# Patient Record
Sex: Female | Born: 1940
Health system: Southern US, Community
[De-identification: ages and names within clinical notes are randomized; demographics above are authoritative.]

## PROBLEM LIST (undated history)

## (undated) DIAGNOSIS — N3020 Other chronic cystitis without hematuria: Secondary | ICD-10-CM

## (undated) DIAGNOSIS — I10 Essential (primary) hypertension: Secondary | ICD-10-CM

## (undated) DIAGNOSIS — Z86010 Personal history of colonic polyps: Secondary | ICD-10-CM

## (undated) DIAGNOSIS — J45909 Unspecified asthma, uncomplicated: Secondary | ICD-10-CM

## (undated) DIAGNOSIS — E785 Hyperlipidemia, unspecified: Secondary | ICD-10-CM

## (undated) HISTORY — PX: WISDOM TOOTH EXTRACTION: SHX21

## (undated) HISTORY — PX: BLADDER SURGERY: SHX569

## (undated) HISTORY — DX: Other chronic cystitis without hematuria: N30.20

## (undated) HISTORY — DX: Personal history of colonic polyps: Z86.010

## (undated) HISTORY — DX: Essential (primary) hypertension: I10

## (undated) HISTORY — PX: MYOMECTOMY: SHX85

## (undated) HISTORY — DX: Hyperlipidemia, unspecified: E78.5

## (undated) HISTORY — PX: COLONOSCOPY: SHX174

## (undated) HISTORY — DX: Unspecified asthma, uncomplicated: J45.909

---

## 2011-03-30 ENCOUNTER — Other Ambulatory Visit (HOSPITAL_COMMUNITY)
Admission: RE | Admit: 2011-03-30 | Discharge: 2011-03-30 | Disposition: A | Discharge status: Home / Self Care | Payer: No Typology Code available for payment source | Source: Ambulatory Visit | Attending: Internal Medicine | Admitting: Internal Medicine

## 2011-03-30 DIAGNOSIS — Z01419 Encounter for gynecological examination (general) (routine) without abnormal findings: Secondary | ICD-10-CM | POA: Insufficient documentation

## 2011-04-01 ENCOUNTER — Other Ambulatory Visit: Payer: Self-pay | Admitting: Internal Medicine

## 2011-04-01 DIAGNOSIS — D259 Leiomyoma of uterus, unspecified: Secondary | ICD-10-CM

## 2011-04-03 ENCOUNTER — Other Ambulatory Visit: Payer: Self-pay

## 2015-03-08 DIAGNOSIS — E559 Vitamin D deficiency, unspecified: Secondary | ICD-10-CM | POA: Diagnosis not present

## 2015-05-22 DIAGNOSIS — Z803 Family history of malignant neoplasm of breast: Secondary | ICD-10-CM | POA: Diagnosis not present

## 2015-05-22 DIAGNOSIS — Z1231 Encounter for screening mammogram for malignant neoplasm of breast: Secondary | ICD-10-CM | POA: Diagnosis not present

## 2015-07-22 DIAGNOSIS — R300 Dysuria: Secondary | ICD-10-CM | POA: Diagnosis not present

## 2015-07-22 DIAGNOSIS — N390 Urinary tract infection, site not specified: Secondary | ICD-10-CM | POA: Diagnosis not present

## 2015-09-09 DIAGNOSIS — R3129 Other microscopic hematuria: Secondary | ICD-10-CM | POA: Diagnosis not present

## 2015-09-09 DIAGNOSIS — Z0000 Encounter for general adult medical examination without abnormal findings: Secondary | ICD-10-CM | POA: Diagnosis not present

## 2015-12-16 DIAGNOSIS — E7800 Pure hypercholesterolemia, unspecified: Secondary | ICD-10-CM | POA: Diagnosis not present

## 2015-12-16 DIAGNOSIS — E559 Vitamin D deficiency, unspecified: Secondary | ICD-10-CM | POA: Diagnosis not present

## 2015-12-16 DIAGNOSIS — I10 Essential (primary) hypertension: Secondary | ICD-10-CM | POA: Diagnosis not present

## 2015-12-19 DIAGNOSIS — I10 Essential (primary) hypertension: Secondary | ICD-10-CM | POA: Diagnosis not present

## 2015-12-19 DIAGNOSIS — Z23 Encounter for immunization: Secondary | ICD-10-CM | POA: Diagnosis not present

## 2015-12-19 DIAGNOSIS — Z0000 Encounter for general adult medical examination without abnormal findings: Secondary | ICD-10-CM | POA: Diagnosis not present

## 2015-12-19 DIAGNOSIS — E559 Vitamin D deficiency, unspecified: Secondary | ICD-10-CM | POA: Diagnosis not present

## 2015-12-20 ENCOUNTER — Encounter: Payer: Self-pay | Admitting: Internal Medicine

## 2016-02-05 ENCOUNTER — Encounter (INDEPENDENT_AMBULATORY_CARE_PROVIDER_SITE_OTHER): Payer: Self-pay

## 2016-02-05 ENCOUNTER — Ambulatory Visit (AMBULATORY_SURGERY_CENTER): Payer: Self-pay | Admitting: *Deleted

## 2016-02-05 ENCOUNTER — Encounter: Payer: Self-pay | Admitting: Internal Medicine

## 2016-02-05 VITALS — Ht 59.0 in | Wt 202.6 lb

## 2016-02-05 DIAGNOSIS — Z1211 Encounter for screening for malignant neoplasm of colon: Secondary | ICD-10-CM

## 2016-02-05 NOTE — Progress Notes (Signed)
No allergies to eggs or soy. No problems with anesthesia.  Pt given Emmi instructions for colonoscopy  No oxygen use  No diet drug use  Pt says she does not know who did her colonoscopy in 2005.  She is going to check with her primary care MD in [place] and call back with info.  Release of information form signed

## 2016-02-19 ENCOUNTER — Encounter: Payer: Self-pay | Admitting: Internal Medicine

## 2016-02-19 ENCOUNTER — Ambulatory Visit (AMBULATORY_SURGERY_CENTER): Payer: Medicare Other | Admitting: Internal Medicine

## 2016-02-19 VITALS — BP 106/66 | HR 59 | Temp 96.0°F | Resp 12 | Ht 59.0 in | Wt 202.0 lb

## 2016-02-19 DIAGNOSIS — D120 Benign neoplasm of cecum: Secondary | ICD-10-CM | POA: Diagnosis not present

## 2016-02-19 DIAGNOSIS — D123 Benign neoplasm of transverse colon: Secondary | ICD-10-CM

## 2016-02-19 DIAGNOSIS — Z1212 Encounter for screening for malignant neoplasm of rectum: Secondary | ICD-10-CM

## 2016-02-19 DIAGNOSIS — Z1211 Encounter for screening for malignant neoplasm of colon: Secondary | ICD-10-CM | POA: Diagnosis present

## 2016-02-19 MED ORDER — SODIUM CHLORIDE 0.9 % IV SOLN: 500.0000 mL | INTRAVENOUS | Status: DC

## 2016-02-19 NOTE — Op Note (Addendum)
[facility] Patient Name: Diana Mcknight Procedure Date: 02/19/2016 10:17 AM MRN: [id number] Endoscopist: Gatha Mayer , MD Age: 75 Referring MD:  Date of Birth: 03-Dec-1940 Gender: Female Account #: [id number] Procedure:                Colonoscopy Indications:              Screening for colorectal malignant neoplasm, Last                            colonoscopy: 2005 Medicines:                Propofol per Anesthesia, Monitored Anesthesia Care Procedure:                Pre-Anesthesia Assessment:                           - Prior to the procedure, a History and Physical                            was performed, and patient medications and                            allergies were reviewed. The patient's tolerance of                            previous anesthesia was also reviewed. The risks                            and benefits of the procedure and the sedation                            options and risks were discussed with the patient.                            All questions were answered, and informed consent                            was obtained. Prior Anticoagulants: The patient has                            taken no previous anticoagulant or antiplatelet                            agents. ASA Grade Assessment: II - A patient with                            mild systemic disease. After reviewing the risks                            and benefits, the patient was deemed in                            satisfactory condition to undergo the procedure.  After obtaining informed consent, the colonoscope                            was passed under direct vision. Throughout the                            procedure, the patient's blood pressure, pulse, and                            oxygen saturations were monitored continuously. The                            Model CF-HQ190L [id number]) scope was introduced                            through the  anus and advanced to the the cecum,                            identified by appendiceal orifice and ileocecal                            valve. The colonoscopy was performed without                            difficulty. The patient tolerated the procedure                            well. The quality of the bowel preparation was                            excellent. The bowel preparation used was Miralax.                            The ileocecal valve, appendiceal orifice, and                            rectum were photographed. Scope In: 10:28:25 AM Scope Out: 10:43:27 AM Scope Withdrawal Time: 0 hours 12 minutes 32 seconds  Total Procedure Duration: 0 hours 15 minutes 2 seconds  Findings:                 The perianal and digital rectal examinations were                            normal.                           Three pedunculated polyps were found in the                            transverse colon, proximal transverse colon and                            cecum. The polyps were 8 to 12 mm in size. These  polyps were removed with a hot snare. Resection and                            retrieval were complete. Verification of patient                            identification for the specimen was done. Estimated                            blood loss: none.                           Diverticula were found in the sigmoid colon.                           Diverticula were found in the ascending colon.                           Internal hemorrhoids were found during retroflexion.                           The exam was otherwise without abnormality on                            direct and retroflexion views. Complications:            No immediate complications. Estimated Blood Loss:     Estimated blood loss: none. Impression:               - Three 8 to 12 mm polyps in the transverse colon,                            in the proximal transverse colon and in the cecum,                             removed with a hot snare. Resected and retrieved.                           - Severe diverticulosis in the sigmoid colon.                           - Moderate diverticulosis in the ascending colon.                           - Internal hemorrhoids.                           - The examination was otherwise normal on direct                            and retroflexion views. Recommendation:           - Patient has a contact number available for                            emergencies. The signs  and symptoms of potential                            delayed complications were discussed with the                            patient. Return to normal activities tomorrow.                            Written discharge instructions were provided to the                            patient.                           - Resume previous diet.                           - Continue present medications.                           - No aspirin, ibuprofen, naproxen, or other                            non-steroidal anti-inflammatory drugs for 2 weeks                            after polyp removal.                           - Repeat colonoscopy is recommended. The                            colonoscopy date will be determined after pathology                            results from today's exam become available for                            review.                           -                           SHE IS 74 SO THIS COULD BE LAST ROUTINE COLONOSCOPY                            - THAT IS HER PREFERENCE Gatha Mayer, MD 02/19/2016 10:55:07 AM This report has been signed electronically.

## 2016-02-19 NOTE — Progress Notes (Signed)
Report to PACU, RN, vss, BBS= Clear.  

## 2016-02-19 NOTE — Patient Instructions (Addendum)
I found and removed 3 polyps today - all look benign.  I will let you know pathology results and when to have another routine colonoscopy by mail.  You also have a condition called diverticulosis - common and not usually a problem. Please read the handout provided.   I appreciate the opportunity to care for you. Gatha Mayer, MD, FACG  YOU HAD AN ENDOSCOPIC PROCEDURE TODAY AT [facility]:   Refer to the procedure report that was given to you for any specific questions about what was found during the examination.  If the procedure report does not answer your questions, please call your gastroenterologist to clarify.  If you requested that your care partner not be given the details of your procedure findings, then the procedure report has been included in a sealed envelope for you to review at your convenience later.  YOU SHOULD EXPECT: Some feelings of bloating in the abdomen. Passage of more gas than usual.  Walking can help get rid of the air that was put into your GI tract during the procedure and reduce the bloating. If you had a lower endoscopy (such as a colonoscopy or flexible sigmoidoscopy) you may notice spotting of blood in your stool or on the toilet paper. If you underwent a bowel prep for your procedure, you may not have a normal bowel movement for a few days.  Please Note:  You might notice some irritation and congestion in your nose or some drainage.  This is from the oxygen used during your procedure.  There is no need for concern and it should clear up in a day or so.  SYMPTOMS TO REPORT IMMEDIATELY:   Following lower endoscopy (colonoscopy or flexible sigmoidoscopy):  Excessive amounts of blood in the stool  Significant tenderness or worsening of abdominal pains  Swelling of the abdomen that is new, acute  Fever of 100F or higher   Following upper endoscopy (EGD)  Vomiting of blood or coffee ground material  New chest pain or pain under the  shoulder blades  Painful or persistently difficult swallowing  New shortness of breath  Fever of 100F or higher  Black, tarry-looking stools  For urgent or emergent issues, a gastroenterologist can be reached at any hour by calling [phone].   DIET:  We do recommend a small meal at first, but then you may proceed to your regular diet.  Drink plenty of fluids but you should avoid alcoholic beverages for 24 hours.  ACTIVITY:  You should plan to take it easy for the rest of today and you should NOT DRIVE or use heavy machinery until tomorrow (because of the sedation medicines used during the test).    FOLLOW UP: Our staff will call the number listed on your records the next business day following your procedure to check on you and address any questions or concerns that you may have regarding the information given to you following your procedure. If we do not reach you, we will leave a message.  However, if you are feeling well and you are not experiencing any problems, there is no need to return our call.  We will assume that you have returned to your regular daily activities without incident.  If any biopsies were taken you will be contacted by phone or by letter within the next 1-3 weeks.  Please call us at [phone] if you have not heard about the biopsies in 3 weeks.    SIGNATURES/CONFIDENTIALITY: You and/or your  care partner have signed paperwork which will be entered into your electronic medical record.  These signatures attest to the fact that that the information above on your After Visit Summary has been reviewed and is understood.  Full responsibility of the confidentiality of this discharge information lies with you and/or your care-partner.  Polyp, diverticulosis, and hemorrhoid information given.

## 2016-02-19 NOTE — Progress Notes (Signed)
Called to room to assist during endoscopic procedure.  Patient ID and intended procedure confirmed with present staff. Received instructions for my participation in the procedure from the performing physician.  

## 2016-02-20 ENCOUNTER — Telehealth: Payer: Self-pay

## 2016-02-20 NOTE — Telephone Encounter (Signed)
  Follow up Call-  Call back number 02/19/2016  Post procedure Call Back phone  # [phone] hm  Permission to leave phone message Yes  Some recent data might be hidden     Patient questions:  Do you have a fever, pain , or abdominal swelling? No. Pain Score  0 *  Have you tolerated food without any problems? Yes.    Have you been able to return to your normal activities? Yes.    Do you have any questions about your discharge instructions: Diet   No. Medications  No. Follow up visit  No.  Do you have questions or concerns about your Care? No.  Actions: * If pain score is 4 or above: No action needed, pain <4.

## 2016-02-24 ENCOUNTER — Encounter: Payer: Self-pay | Admitting: Internal Medicine

## 2016-02-24 DIAGNOSIS — Z860101 Personal history of adenomatous and serrated colon polyps: Secondary | ICD-10-CM

## 2016-02-24 DIAGNOSIS — Z86010 Personal history of colonic polyps: Secondary | ICD-10-CM

## 2016-02-24 HISTORY — DX: Personal history of colonic polyps: Z86.010

## 2016-02-24 HISTORY — DX: Personal history of adenomatous and serrated colon polyps: Z86.0101

## 2016-02-24 NOTE — Progress Notes (Signed)
Three 8-12 mm adenomas No recall - patient preference

## 2016-03-09 ENCOUNTER — Other Ambulatory Visit (HOSPITAL_COMMUNITY): Payer: Self-pay | Admitting: Internal Medicine

## 2016-03-09 DIAGNOSIS — R600 Localized edema: Secondary | ICD-10-CM | POA: Diagnosis not present

## 2016-03-09 DIAGNOSIS — M7989 Other specified soft tissue disorders: Secondary | ICD-10-CM

## 2016-03-09 DIAGNOSIS — M79605 Pain in left leg: Secondary | ICD-10-CM | POA: Diagnosis not present

## 2016-03-10 ENCOUNTER — Ambulatory Visit (HOSPITAL_COMMUNITY)
Admission: RE | Admit: 2016-03-10 | Discharge: 2016-03-10 | Disposition: A | Discharge status: Home / Self Care | Payer: Medicare Other | Source: Ambulatory Visit | Attending: Vascular Surgery | Admitting: Vascular Surgery

## 2016-03-10 DIAGNOSIS — M7989 Other specified soft tissue disorders: Secondary | ICD-10-CM

## 2016-03-10 DIAGNOSIS — R600 Localized edema: Secondary | ICD-10-CM | POA: Insufficient documentation

## 2016-03-12 DIAGNOSIS — R3121 Asymptomatic microscopic hematuria: Secondary | ICD-10-CM | POA: Diagnosis not present

## 2016-05-22 DIAGNOSIS — Z803 Family history of malignant neoplasm of breast: Secondary | ICD-10-CM | POA: Diagnosis not present

## 2016-05-22 DIAGNOSIS — Z1231 Encounter for screening mammogram for malignant neoplasm of breast: Secondary | ICD-10-CM | POA: Diagnosis not present

## 2016-06-22 DIAGNOSIS — I10 Essential (primary) hypertension: Secondary | ICD-10-CM | POA: Diagnosis not present

## 2016-09-08 DIAGNOSIS — R3121 Asymptomatic microscopic hematuria: Secondary | ICD-10-CM | POA: Diagnosis not present

## 2016-12-15 DIAGNOSIS — E7800 Pure hypercholesterolemia, unspecified: Secondary | ICD-10-CM | POA: Diagnosis not present

## 2016-12-15 DIAGNOSIS — E559 Vitamin D deficiency, unspecified: Secondary | ICD-10-CM | POA: Diagnosis not present

## 2016-12-15 DIAGNOSIS — Z0000 Encounter for general adult medical examination without abnormal findings: Secondary | ICD-10-CM | POA: Diagnosis not present

## 2016-12-22 DIAGNOSIS — Z0000 Encounter for general adult medical examination without abnormal findings: Secondary | ICD-10-CM | POA: Diagnosis not present

## 2016-12-22 DIAGNOSIS — R319 Hematuria, unspecified: Secondary | ICD-10-CM | POA: Diagnosis not present

## 2016-12-22 DIAGNOSIS — Z1212 Encounter for screening for malignant neoplasm of rectum: Secondary | ICD-10-CM | POA: Diagnosis not present

## 2016-12-22 DIAGNOSIS — I10 Essential (primary) hypertension: Secondary | ICD-10-CM | POA: Diagnosis not present

## 2017-01-07 DIAGNOSIS — I10 Essential (primary) hypertension: Secondary | ICD-10-CM | POA: Diagnosis not present

## 2017-01-07 DIAGNOSIS — E782 Mixed hyperlipidemia: Secondary | ICD-10-CM | POA: Diagnosis not present

## 2017-02-22 DIAGNOSIS — Z23 Encounter for immunization: Secondary | ICD-10-CM | POA: Diagnosis not present

## 2017-02-22 DIAGNOSIS — M859 Disorder of bone density and structure, unspecified: Secondary | ICD-10-CM | POA: Diagnosis not present

## 2017-03-01 DIAGNOSIS — H2513 Age-related nuclear cataract, bilateral: Secondary | ICD-10-CM | POA: Diagnosis not present

## 2017-05-14 DIAGNOSIS — Z1231 Encounter for screening mammogram for malignant neoplasm of breast: Secondary | ICD-10-CM | POA: Diagnosis not present

## 2017-08-31 DIAGNOSIS — E7800 Pure hypercholesterolemia, unspecified: Secondary | ICD-10-CM | POA: Diagnosis not present

## 2017-08-31 DIAGNOSIS — I10 Essential (primary) hypertension: Secondary | ICD-10-CM | POA: Diagnosis not present

## 2017-08-31 DIAGNOSIS — J309 Allergic rhinitis, unspecified: Secondary | ICD-10-CM | POA: Diagnosis not present

## 2017-09-09 DIAGNOSIS — R3121 Asymptomatic microscopic hematuria: Secondary | ICD-10-CM | POA: Diagnosis not present

## 2017-12-21 DIAGNOSIS — E559 Vitamin D deficiency, unspecified: Secondary | ICD-10-CM | POA: Diagnosis not present

## 2017-12-21 DIAGNOSIS — E7800 Pure hypercholesterolemia, unspecified: Secondary | ICD-10-CM | POA: Diagnosis not present

## 2017-12-21 DIAGNOSIS — Z0001 Encounter for general adult medical examination with abnormal findings: Secondary | ICD-10-CM | POA: Diagnosis not present

## 2017-12-21 DIAGNOSIS — I10 Essential (primary) hypertension: Secondary | ICD-10-CM | POA: Diagnosis not present

## 2017-12-28 DIAGNOSIS — I10 Essential (primary) hypertension: Secondary | ICD-10-CM | POA: Diagnosis not present

## 2017-12-28 DIAGNOSIS — Z0000 Encounter for general adult medical examination without abnormal findings: Secondary | ICD-10-CM | POA: Diagnosis not present

## 2017-12-28 DIAGNOSIS — R319 Hematuria, unspecified: Secondary | ICD-10-CM | POA: Diagnosis not present

## 2017-12-28 DIAGNOSIS — D72819 Decreased white blood cell count, unspecified: Secondary | ICD-10-CM | POA: Diagnosis not present

## 2017-12-28 DIAGNOSIS — E559 Vitamin D deficiency, unspecified: Secondary | ICD-10-CM | POA: Diagnosis not present

## 2017-12-28 DIAGNOSIS — Z1212 Encounter for screening for malignant neoplasm of rectum: Secondary | ICD-10-CM | POA: Diagnosis not present

## 2017-12-28 DIAGNOSIS — Z23 Encounter for immunization: Secondary | ICD-10-CM | POA: Diagnosis not present

## 2018-03-02 DIAGNOSIS — H2513 Age-related nuclear cataract, bilateral: Secondary | ICD-10-CM | POA: Diagnosis not present

## 2018-05-23 DIAGNOSIS — Z1231 Encounter for screening mammogram for malignant neoplasm of breast: Secondary | ICD-10-CM | POA: Diagnosis not present

## 2018-05-23 DIAGNOSIS — Z803 Family history of malignant neoplasm of breast: Secondary | ICD-10-CM | POA: Diagnosis not present

## 2018-08-31 DIAGNOSIS — I10 Essential (primary) hypertension: Secondary | ICD-10-CM | POA: Diagnosis not present

## 2018-08-31 DIAGNOSIS — M79604 Pain in right leg: Secondary | ICD-10-CM | POA: Diagnosis not present

## 2018-09-05 DIAGNOSIS — M79605 Pain in left leg: Secondary | ICD-10-CM | POA: Insufficient documentation

## 2018-09-05 DIAGNOSIS — M79604 Pain in right leg: Secondary | ICD-10-CM | POA: Diagnosis not present

## 2018-09-12 DIAGNOSIS — M79605 Pain in left leg: Secondary | ICD-10-CM | POA: Diagnosis not present

## 2018-09-21 DIAGNOSIS — M79605 Pain in left leg: Secondary | ICD-10-CM | POA: Diagnosis not present

## 2018-09-21 DIAGNOSIS — M79604 Pain in right leg: Secondary | ICD-10-CM | POA: Diagnosis not present

## 2018-11-04 ENCOUNTER — Other Ambulatory Visit: Payer: Self-pay

## 2018-11-04 DIAGNOSIS — L89899 Pressure ulcer of other site, unspecified stage: Secondary | ICD-10-CM

## 2018-11-08 ENCOUNTER — Encounter: Payer: Self-pay | Admitting: Vascular Surgery

## 2018-11-08 ENCOUNTER — Ambulatory Visit (HOSPITAL_COMMUNITY)
Admission: RE | Admit: 2018-11-08 | Discharge: 2018-11-08 | Disposition: A | Discharge status: Home / Self Care | Payer: Medicare Other | Source: Ambulatory Visit | Attending: Vascular Surgery | Admitting: Vascular Surgery

## 2018-11-08 ENCOUNTER — Ambulatory Visit (INDEPENDENT_AMBULATORY_CARE_PROVIDER_SITE_OTHER): Payer: Medicare Other | Admitting: Vascular Surgery

## 2018-11-08 ENCOUNTER — Other Ambulatory Visit: Payer: Self-pay

## 2018-11-08 ENCOUNTER — Encounter: Payer: Self-pay | Admitting: *Deleted

## 2018-11-08 VITALS — BP 113/75 | HR 69 | Temp 97.4°F | Resp 20 | Ht 59.0 in | Wt 199.3 lb

## 2018-11-08 DIAGNOSIS — M79604 Pain in right leg: Secondary | ICD-10-CM | POA: Diagnosis not present

## 2018-11-08 DIAGNOSIS — I10 Essential (primary) hypertension: Secondary | ICD-10-CM | POA: Insufficient documentation

## 2018-11-08 DIAGNOSIS — E785 Hyperlipidemia, unspecified: Secondary | ICD-10-CM | POA: Insufficient documentation

## 2018-11-08 DIAGNOSIS — L89899 Pressure ulcer of other site, unspecified stage: Secondary | ICD-10-CM | POA: Insufficient documentation

## 2018-11-08 DIAGNOSIS — J45909 Unspecified asthma, uncomplicated: Secondary | ICD-10-CM | POA: Insufficient documentation

## 2018-11-08 NOTE — Progress Notes (Signed)
Patient name: Diana Mcknight MRN: [id number] DOB: 07/20/1940 Sex: female  REASON FOR CONSULT: Evaluate for right leg arterial insufficiency, pain in right calf  HPI: [name] is a 78 y.o. female, with history of hypertension and hyperlipidemia that presents for evaluation of right calf pain and possible arterial insufficiency.  Patient reports that she is recovering from a right gastrocnemius strain back in April of this year.  She feels that this potentially aggravated an old injury from [place] that occurred about 20 years ago in the same calf.  States she was getting out of her computer chair after working in the garden when it happened.  She subsequently noticed that sometimes at night she gets pain in her right heel and some numbness and was worried that she potentially had vascular insufficiency.  This was also in the setting of some discoloration on the top of the right foot.  She denies any symptoms consistent with claudication.  She has no tissue loss or other nonhealing wounds.  She states the toes themselves do not hurt.  No previous revascularizations.  No previous blood clots.  She states she gardens and tries to be active and walks a fair amount.  Past Medical History:  Diagnosis Date  . Asthma    childhood asthma  . Chronic cystitis   . Hx of adenomatous colonic polyps 02/24/2016  . Hyperlipidemia   . Hypertension     Past Surgical History:  Procedure Laterality Date  . BLADDER SURGERY     fulguration  . COLONOSCOPY    . MYOMECTOMY    . WISDOM TOOTH EXTRACTION      Family History  Problem Relation Age of Onset  . Liver cancer Brother   . Colon cancer Neg Hx   . Esophageal cancer Neg Hx   . Pancreatic cancer Neg Hx   . Rectal cancer Neg Hx   . Stomach cancer Neg Hx     SOCIAL HISTORY: Social History   Socioeconomic History  . Marital status: Unknown    Spouse name: Not on file  . Number of children: Not on file  . Years of education: Not on file  .  Highest education level: Not on file  Occupational History  . Not on file  Social Needs  . Financial resource strain: Not on file  . Food insecurity    Worry: Not on file    Inability: Not on file  . Transportation needs    Medical: Not on file    Non-medical: Not on file  Tobacco Use  . Smoking status: Never Smoker  . Smokeless tobacco: Never Used  Substance and Sexual Activity  . Alcohol use: No  . Drug use: No  . Sexual activity: Not on file  Lifestyle  . Physical activity    Days per week: Not on file    Minutes per session: Not on file  . Stress: Not on file  Relationships  . Social Herbalist on phone: Not on file    Gets together: Not on file    Attends religious service: Not on file    Active member of club or organization: Not on file    Attends meetings of clubs or organizations: Not on file    Relationship status: Not on file  . Intimate partner violence    Fear of current or ex partner: Not on file    Emotionally abused: Not on file    Physically abused: Not on file  Forced sexual activity: Not on file  Other Topics Concern  . Not on file  Social History Narrative  . Not on file    Allergies  Allergen Reactions  . Erythromycin Diarrhea  . Penicillins Hives    Current Outpatient Medications  Medication Sig Dispense Refill  . Cholecalciferol (VITAMIN D) 2000 units CAPS Take by mouth daily.    . hydrochlorothiazide (HYDRODIURIL) 25 MG tablet Take 25 mg by mouth daily.    Marland Kitchen losartan (COZAAR) 50 MG tablet Take 50 mg by mouth daily.     Current Facility-Administered Medications  Medication Dose Route Frequency Provider Last Rate Last Dose  . 0.9 %  sodium chloride infusion  500 mL Intravenous Continuous Gatha Mayer, MD        REVIEW OF SYSTEMS:  [X]  denotes positive finding, [ ]  denotes negative finding Cardiac  Comments:  Chest pain or chest pressure:    Shortness of breath upon exertion:    Short of breath when lying flat:     Irregular heart rhythm:        Vascular    Pain in calf, thigh, or hip brought on by ambulation:    Pain in feet at night that wakes you up from your sleep:  x Right heel  Blood clot in your veins:    Leg swelling:         Pulmonary    Oxygen at home:    Productive cough:     Wheezing:         Neurologic    Sudden weakness in arms or legs:     Sudden numbness in arms or legs:     Sudden onset of difficulty speaking or slurred speech:    Temporary loss of vision in one eye:     Problems with dizziness:         Gastrointestinal    Blood in stool:     Vomited blood:         Genitourinary    Burning when urinating:     Blood in urine:        Psychiatric    Major depression:         Hematologic    Bleeding problems:    Problems with blood clotting too easily:        Skin    Rashes or ulcers:        Constitutional    Fever or chills:      PHYSICAL EXAM: Vitals:   11/08/18 1441  BP: 113/75  Pulse: 69  Resp: 20  Temp: (!) 97.4 F (36.3 C)  Weight: 199 lb 4.8 oz (90.4 kg)  Height: 4\' 11"  (1.499 m)    GENERAL: The patient is a well-nourished female, in no acute distress. The vital signs are documented above. CARDIAC: There is a regular rate and rhythm.  VASCULAR:  2+ radial pulse palpable bilaterally 2+ femoral pulse palpable bilaterally 2+ DP palpable bilaterally No tissue loss PULMONARY: There is good air exchange bilaterally without wheezing or rales. ABDOMEN: Soft and non-tender with normal pitched bowel sounds.  MUSCULOSKELETAL: There are no major deformities or cyanosis. NEUROLOGIC: No focal weakness or paresthesias are detected. SKIN: There are no ulcers or rashes noted. PSYCHIATRIC: The patient has a normal affect.  DATA:   I independently reviewed her noninvasive imaging and she has normal ABIs greater than 1.0 in bilateral lower extremities with triphasic waveforms and normal toe pressures.  Assessment/Plan:  78 year old female who presents  for evaluation  of possible arterial insufficiency in the right lower extremity after calf muscle strain with some numbness in her right heel.  I discussed with her in detail that I do not think she has any concern for underlying vascular disease.  She has normal ABIs with triphasic waveforms at both ankles.  In addition she has palpable dorsalis pedis pulses on exam.  Her feet are warm and appear very well perfused.  I think she can follow with up with [place] as needed.  I do not think she needs any vascular intervention at this time.    Marty Heck, MD [facility] Office: [phone] Pager: [phone]

## 2018-11-17 DIAGNOSIS — R3121 Asymptomatic microscopic hematuria: Secondary | ICD-10-CM | POA: Diagnosis not present

## 2018-12-28 DIAGNOSIS — E559 Vitamin D deficiency, unspecified: Secondary | ICD-10-CM | POA: Diagnosis not present

## 2018-12-28 DIAGNOSIS — E7800 Pure hypercholesterolemia, unspecified: Secondary | ICD-10-CM | POA: Diagnosis not present

## 2018-12-28 DIAGNOSIS — Z0000 Encounter for general adult medical examination without abnormal findings: Secondary | ICD-10-CM | POA: Diagnosis not present

## 2019-01-04 DIAGNOSIS — Z780 Asymptomatic menopausal state: Secondary | ICD-10-CM | POA: Diagnosis not present

## 2019-01-04 DIAGNOSIS — Z23 Encounter for immunization: Secondary | ICD-10-CM | POA: Diagnosis not present

## 2019-01-04 DIAGNOSIS — Z0000 Encounter for general adult medical examination without abnormal findings: Secondary | ICD-10-CM | POA: Diagnosis not present

## 2019-01-04 DIAGNOSIS — M8580 Other specified disorders of bone density and structure, unspecified site: Secondary | ICD-10-CM | POA: Diagnosis not present

## 2019-02-10 ENCOUNTER — Encounter: Payer: Self-pay | Admitting: Internal Medicine

## 2019-05-29 DIAGNOSIS — Z1231 Encounter for screening mammogram for malignant neoplasm of breast: Secondary | ICD-10-CM | POA: Diagnosis not present

## 2019-08-29 DIAGNOSIS — L03115 Cellulitis of right lower limb: Secondary | ICD-10-CM | POA: Diagnosis not present

## 2019-09-01 DIAGNOSIS — L03115 Cellulitis of right lower limb: Secondary | ICD-10-CM | POA: Diagnosis not present

## 2020-01-04 DIAGNOSIS — E7800 Pure hypercholesterolemia, unspecified: Secondary | ICD-10-CM | POA: Diagnosis not present

## 2020-01-10 DIAGNOSIS — Z0000 Encounter for general adult medical examination without abnormal findings: Secondary | ICD-10-CM | POA: Diagnosis not present

## 2020-01-10 DIAGNOSIS — I10 Essential (primary) hypertension: Secondary | ICD-10-CM | POA: Diagnosis not present

## 2020-01-10 DIAGNOSIS — E7800 Pure hypercholesterolemia, unspecified: Secondary | ICD-10-CM | POA: Diagnosis not present

## 2020-01-18 DIAGNOSIS — I10 Essential (primary) hypertension: Secondary | ICD-10-CM | POA: Diagnosis not present

## 2020-01-18 DIAGNOSIS — Z1212 Encounter for screening for malignant neoplasm of rectum: Secondary | ICD-10-CM | POA: Diagnosis not present

## 2020-01-31 DIAGNOSIS — H2513 Age-related nuclear cataract, bilateral: Secondary | ICD-10-CM | POA: Diagnosis not present

## 2020-06-03 DIAGNOSIS — Z1231 Encounter for screening mammogram for malignant neoplasm of breast: Secondary | ICD-10-CM | POA: Diagnosis not present

## 2020-06-17 DIAGNOSIS — R928 Other abnormal and inconclusive findings on diagnostic imaging of breast: Secondary | ICD-10-CM | POA: Diagnosis not present

## 2020-06-17 DIAGNOSIS — R922 Inconclusive mammogram: Secondary | ICD-10-CM | POA: Diagnosis not present

## 2020-07-10 ENCOUNTER — Other Ambulatory Visit: Payer: Self-pay | Admitting: Radiology

## 2020-07-10 DIAGNOSIS — D0511 Intraductal carcinoma in situ of right breast: Secondary | ICD-10-CM | POA: Diagnosis not present

## 2020-07-12 ENCOUNTER — Telehealth: Payer: Self-pay | Admitting: *Deleted

## 2020-07-12 ENCOUNTER — Telehealth: Payer: Self-pay | Admitting: Internal Medicine

## 2020-07-12 ENCOUNTER — Encounter: Payer: Self-pay | Admitting: *Deleted

## 2020-07-12 DIAGNOSIS — D0511 Intraductal carcinoma in situ of right breast: Secondary | ICD-10-CM | POA: Insufficient documentation

## 2020-07-12 NOTE — Telephone Encounter (Signed)
I called and spoke with patient to let her know that her transportation is all set up for 3/23. They will pick her up between 7:30 and 7:45. Transportation will call her today as well to confirm. Patient verbalized understanding.

## 2020-07-12 NOTE — Telephone Encounter (Signed)
   Diana Mcknight DOB: 1941/01/31 MRN: [id number]   RIDER WAIVER AND RELEASE OF LIABILITY  For purposes of improving physical access to our facilities, [facility] is pleased to partner with third parties to provide [facility] patients or other authorized individuals the option of convenient, on-demand ground transportation services (the [place]") through use of the technology service that enables users to request on-demand ground transportation from independent third-party providers.  By opting to use and accept these [employer], I, the undersigned, hereby agree on behalf of myself, and on behalf of any minor child using the [employer] for whom I am the parent or legal guardian, as follows:  1. Government social research officer provided to me are provided by independent third-party transportation providers who are not [employer] or employees and who are unaffiliated with [employer]. 2. [facility] is neither a transportation carrier nor a common or public carrier. 3. [facility] has no control over the quality or safety of the transportation that occurs as a result of the [employer]. 4. [facility] cannot guarantee that any third-party transportation provider will complete any arranged transportation service. 5. [facility] makes no representation, warranty, or guarantee regarding the reliability, timeliness, quality, safety, suitability, or availability of any of the Transport Services or that they will be error free. 6. I fully understand that traveling by vehicle involves risks and dangers of serious bodily injury, including permanent disability, paralysis, and death. I agree, on behalf of myself and on behalf of any minor child using the Transport Services for whom I am the parent or legal guardian, that the entire risk arising out of my use of the [employer] remains solely with me, to the maximum extent permitted under applicable law. 7. The [employer] are provided "as is" and "as available." [facility] disclaims all representations and warranties, express, implied or statutory, not expressly set out in these terms, including the implied warranties of merchantability and fitness for a particular purpose. 8. I hereby waive and release [facility], its agents, employees, officers, directors, representatives, insurers, attorneys, assigns, successors, subsidiaries, and affiliates from any and all past, present, or future claims, demands, liabilities, actions, causes of action, or suits of any kind directly or indirectly arising from acceptance and use of the [employer]. 9. I further waive and release [facility] and its affiliates from all present and future liability and responsibility for any injury or death to persons or damages to property caused by or related to the use of the [employer]. 10. I have read this Waiver and Release of Liability, and I understand the terms used in it and their legal significance. This Waiver is freely and voluntarily given with the understanding that my right (as well as the right of any minor child for whom I am the parent or legal guardian using the [employer]) to legal recourse against [place] in connection with the [employer] is knowingly surrendered in return for use of these services.   I attest that I read the consent document to Diana Mcknight, gave Ms. Westermeyer the opportunity to ask questions and answered the questions asked (if any). I affirm that Arleny Kruger then provided consent for she's participation in this program.     Diana Mcknight

## 2020-07-12 NOTE — Telephone Encounter (Signed)
Spoke with patient to discuss [facility] for July 17, 2020 at 815.  Patient will need transportation for this appointment and I will send referral for that. Gave instructions and contact information should she have any questions.  Patient verbalized understanding.

## 2020-07-15 ENCOUNTER — Encounter: Payer: Self-pay | Admitting: Internal Medicine

## 2020-07-17 ENCOUNTER — Inpatient Hospital Stay: Payer: Medicare Other | Attending: Hematology | Admitting: Hematology

## 2020-07-17 ENCOUNTER — Encounter: Payer: Self-pay | Admitting: Licensed Clinical Social Worker

## 2020-07-17 ENCOUNTER — Encounter: Payer: Self-pay | Admitting: *Deleted

## 2020-07-17 ENCOUNTER — Inpatient Hospital Stay: Payer: Medicare Other

## 2020-07-17 ENCOUNTER — Ambulatory Visit
Admission: RE | Admit: 2020-07-17 | Discharge: 2020-07-17 | Disposition: A | Discharge status: Home / Self Care | Payer: Medicare Other | Source: Ambulatory Visit | Attending: Radiation Oncology | Admitting: Radiation Oncology

## 2020-07-17 ENCOUNTER — Encounter: Payer: Self-pay | Admitting: Internal Medicine

## 2020-07-17 ENCOUNTER — Encounter: Payer: Self-pay | Admitting: Hematology

## 2020-07-17 ENCOUNTER — Other Ambulatory Visit: Payer: Self-pay

## 2020-07-17 ENCOUNTER — Ambulatory Visit: Payer: Medicare Other | Admitting: Physical Therapy

## 2020-07-17 ENCOUNTER — Ambulatory Visit: Payer: Self-pay | Admitting: General Surgery

## 2020-07-17 VITALS — BP 133/79 | HR 71 | Temp 97.9°F | Resp 18 | Ht 59.0 in | Wt 202.8 lb

## 2020-07-17 DIAGNOSIS — Z800 Family history of malignant neoplasm of digestive organs: Secondary | ICD-10-CM | POA: Diagnosis not present

## 2020-07-17 DIAGNOSIS — D0511 Intraductal carcinoma in situ of right breast: Secondary | ICD-10-CM

## 2020-07-17 DIAGNOSIS — E785 Hyperlipidemia, unspecified: Secondary | ICD-10-CM | POA: Insufficient documentation

## 2020-07-17 DIAGNOSIS — Z803 Family history of malignant neoplasm of breast: Secondary | ICD-10-CM | POA: Diagnosis not present

## 2020-07-17 DIAGNOSIS — I10 Essential (primary) hypertension: Secondary | ICD-10-CM | POA: Insufficient documentation

## 2020-07-17 DIAGNOSIS — Z79899 Other long term (current) drug therapy: Secondary | ICD-10-CM | POA: Insufficient documentation

## 2020-07-17 LAB — CBC WITH DIFFERENTIAL (CANCER CENTER ONLY)
Abs Immature Granulocytes: 0.01 10*3/uL (ref 0.00–0.07)
Basophils Absolute: 0 10*3/uL (ref 0.0–0.1)
Basophils Relative: 1 %
Eosinophils Absolute: 0.1 10*3/uL (ref 0.0–0.5)
Eosinophils Relative: 2 %
HCT: 40.8 % (ref 36.0–46.0)
Hemoglobin: 12.9 g/dL (ref 12.0–15.0)
Immature Granulocytes: 0 %
Lymphocytes Relative: 48 %
Lymphs Abs: 1.8 10*3/uL (ref 0.7–4.0)
MCH: 26.9 pg (ref 26.0–34.0)
MCHC: 31.6 g/dL (ref 30.0–36.0)
MCV: 85.2 fL (ref 80.0–100.0)
Monocytes Absolute: 0.5 10*3/uL (ref 0.1–1.0)
Monocytes Relative: 13 %
Neutro Abs: 1.4 10*3/uL — ABNORMAL LOW (ref 1.7–7.7)
Neutrophils Relative %: 36 %
Platelet Count: 231 10*3/uL (ref 150–400)
RBC: 4.79 MIL/uL (ref 3.87–5.11)
RDW: 14.1 % (ref 11.5–15.5)
WBC Count: 3.8 10*3/uL — ABNORMAL LOW (ref 4.0–10.5)
nRBC: 0 % (ref 0.0–0.2)

## 2020-07-17 LAB — CMP (CANCER CENTER ONLY)
ALT: 16 U/L (ref 0–44)
AST: 22 U/L (ref 15–41)
Albumin: 3.7 g/dL (ref 3.5–5.0)
Alkaline Phosphatase: 64 U/L (ref 38–126)
Anion gap: 12 (ref 5–15)
BUN: 27 mg/dL — ABNORMAL HIGH (ref 8–23)
CO2: 26 mmol/L (ref 22–32)
Calcium: 9.7 mg/dL (ref 8.9–10.3)
Chloride: 103 mmol/L (ref 98–111)
Creatinine: 1.18 mg/dL — ABNORMAL HIGH (ref 0.44–1.00)
GFR, Estimated: 47 mL/min — ABNORMAL LOW (ref >60)
Glucose, Bld: 98 mg/dL (ref 70–99)
Potassium: 3.6 mmol/L (ref 3.5–5.1)
Sodium: 141 mmol/L (ref 135–145)
Total Bilirubin: 0.4 mg/dL (ref 0.3–1.2)
Total Protein: 7.8 g/dL (ref 6.5–8.1)

## 2020-07-17 LAB — GENETIC SCREENING ORDER

## 2020-07-17 MED ORDER — NYSTATIN 100000 UNIT/GM EX POWD: 1.0000 "application " | Freq: Three times a day (TID) | CUTANEOUS | 1 refills | Status: AC

## 2020-07-17 NOTE — Progress Notes (Signed)
[place]   [phone] Fax:([phone]) [place] Note   Patient Care Team: Deland Pretty, MD as PCP - General (Internal Medicine) Rockwell Germany, RN as Oncology Nurse Navigator Mauro Kaufmann, RN as Oncology Nurse Navigator Truitt Merle, MD as Consulting Physician (Hematology) Jovita Kussmaul, MD as Consulting Physician (General Surgery) Kyung Rudd, MD as Consulting Physician (Radiation Oncology)  Date of Service:  07/17/2020   CHIEF COMPLAINTS/PURPOSE OF CONSULTATION:  Ductal carcinoma in situ (DCIS) of right breast   Oncology History Overview Note  Cancer Staging Ductal carcinoma in situ (DCIS) of right breast Staging form: Breast, AJCC 8th Edition - Clinical stage from 07/10/2020: Stage 0 (cTis (DCIS), cN0, cM0, G2, ER+, PR+) - Signed by Truitt Merle, MD on 07/16/2020 Stage prefix: Initial diagnosis Histologic grading system: 3 grade system    Ductal carcinoma in situ (DCIS) of right breast  06/17/2020 Mammogram   The 0.6x0.6x0.5cm oval mass in the 3:00 position of the middle depth right breast, 4cm from the nipple is suspicious of malignancy. An US guided biopsy is recommended.    07/10/2020 Cancer Staging   Staging form: Breast, AJCC 8th Edition - Clinical stage from 07/10/2020: Stage 0 (cTis (DCIS), cN0, cM0, G2, ER+, PR+) - Signed by Truitt Merle, MD on 07/16/2020 Stage prefix: Initial diagnosis Histologic grading system: 3 grade system   07/10/2020 Initial Biopsy   Diagnosis Breast, right, needle core biopsy, 3:00 o'clock, 4 cmfn - DUCTAL CARCINOMA IN SITU INVOLVING A DUCTAL PAPILLOMA. Microscopic Comment E-cadherin is positive. CK5/6 is negative. P63, Calponin and SMM-1 demonstrate the presence of myoepithelium. The DCIS is of intermediate nuclear grade.   07/12/2020 Initial Diagnosis   Ductal carcinoma in situ (DCIS) of right breast      HISTORY OF PRESENTING ILLNESS:  Diana Mcknight 80 y.o. female is a here because of newly  diagnosed right breast DCIS. The patient presents to the breast clinic today alone.  She notes she did not feel her mass herself. He mass was found by screening mammogram. This is her first abnormal mammogram. She denies breast or nipple changes. She notes having fibrocystic breasts She denies any head, chest, abdominal pain.   Socially she is widowed and lives in [place]. She has 2 adult daughters. One daughter lives in [place], the other lives 10 minutes away from her. She is a retired Radio producer. She does not drink, smoke or do recreational drugs.  She has a PMHx of HTN on medication. She no longer has asthma. She is on supplements. She has had 2 surgeries. Her mother had uterine cancer and her brother had liver cancer and 1 cousin with breast cancer. She is still interested in genetic testing.     GYN HISTORY  Menarchal: 11 LMP: 58 Contraceptive: No HRT: No G2P2-first at 27    REVIEW OF SYSTEMS:  Constitutional: Denies fevers, chills or abnormal night sweats Eyes: Denies blurriness of vision, double vision or watery eyes Ears, nose, mouth, throat, and face: Denies mucositis or sore throat Respiratory: Denies cough, dyspnea or wheezes Cardiovascular: Denies palpitation, chest discomfort or lower extremity swelling Gastrointestinal:  Denies nausea, heartburn or change in bowel habits Skin: Denies abnormal skin rashes Lymphatics: Denies new lymphadenopathy or easy bruising Neurological:Denies numbness, tingling or new weaknesses Behavioral/Psych: Mood is stable, no new changes  All other systems were reviewed with the patient and are negative.   MEDICAL HISTORY:  Past Medical History:  Diagnosis Date  . Asthma  childhood asthma  . Chronic cystitis   . Hx of adenomatous colonic polyps 02/24/2016  . Hyperlipidemia   . Hypertension     SURGICAL HISTORY: Past Surgical History:  Procedure Laterality Date  . BLADDER SURGERY     fulguration  . COLONOSCOPY    .  MYOMECTOMY    . WISDOM TOOTH EXTRACTION      SOCIAL HISTORY: Social History   Socioeconomic History  . Marital status: Widowed    Spouse name: Not on file  . Number of children: 2  . Years of education: Not on file  . Highest education level: Not on file  Occupational History  . Occupation: retired Careers information officer   Tobacco Use  . Smoking status: Never Smoker  . Smokeless tobacco: Never Used  Vaping Use  . Vaping Use: Never used  Substance and Sexual Activity  . Alcohol use: No  . Drug use: No  . Sexual activity: Not on file  Other Topics Concern  . Not on file  Social History Narrative  . Not on file   Social Determinants of Health   Financial Resource Strain: Not on file  Food Insecurity: No Food Insecurity  . Worried About Charity fundraiser in the Last Year: Never true  . Ran Out of Food in the Last Year: Never true  Transportation Needs: No Transportation Needs  . Lack of Transportation (Medical): No  . Lack of Transportation (Non-Medical): No  Physical Activity: Not on file  Stress: Not on file  Social Connections: Not on file  Intimate Partner Violence: Not on file    FAMILY HISTORY: Family History  Problem Relation Age of Onset  . Cervical cancer Mother   . Liver cancer Brother   . Breast cancer Cousin   . Colon cancer Neg Hx   . Esophageal cancer Neg Hx   . Pancreatic cancer Neg Hx   . Rectal cancer Neg Hx   . Stomach cancer Neg Hx     ALLERGIES:  is allergic to erythromycin and penicillins.  MEDICATIONS:  Current Outpatient Medications  Medication Sig Dispense Refill  . Cholecalciferol (VITAMIN D) 2000 units CAPS Take by mouth daily.    . hydrochlorothiazide (HYDRODIURIL) 25 MG tablet Take 25 mg by mouth daily.    Marland Kitchen losartan (COZAAR) 50 MG tablet Take 50 mg by mouth daily.    Marland Kitchen nystatin (MYCOSTATIN/NYSTOP) powder Apply 1 application topically 3 (three) times daily. 60 g 1   No current facility-administered medications for this visit.     PHYSICAL EXAMINATION: ECOG PERFORMANCE STATUS: 0 - Asymptomatic  Vitals:   07/17/20 0907  BP: 133/79  Pulse: 71  Resp: 18  Temp: 97.9 F (36.6 C)  SpO2: 98%   Filed Weights   07/17/20 0907  Weight: 202 lb 12.8 oz (92 kg)    GENERAL:alert, no distress and comfortable SKIN: skin color, texture, turgor are normal, no rashes or significant lesions (+) Keratosis of right breast  EYES: normal, Conjunctiva are pink and non-injected, sclera clear  NECK: supple, thyroid normal size, non-tender (+) Very small lesion LYMPH:  no palpable lymphadenopathy in the cervical, axillary  LUNGS: clear to auscultation and percussion with normal breathing effort HEART: regular rate & rhythm and no murmurs and no lower extremity edema ABDOMEN:abdomen soft, non-tender and normal bowel sounds Musculoskeletal:no cyanosis of digits and no clubbing  NEURO: alert & oriented x 3 with fluent speech, no focal motor/sensory deficits BREAST: No palpable mass, nodules or adenopathy bilaterally. Breast exam benign. (+)  Skin erythema under breast   LABORATORY DATA:  I have reviewed the data as listed CBC Latest Ref Rng & Units 07/17/2020  WBC 4.0 - 10.5 K/uL 3.8(L)  Hemoglobin 12.0 - 15.0 g/dL 12.9  Hematocrit 36.0 - 46.0 % 40.8  Platelets 150 - 400 K/uL 231    CMP Latest Ref Rng & Units 07/17/2020  Glucose 70 - 99 mg/dL 98  BUN 8 - 23 mg/dL 27(H)  Creatinine 0.44 - 1.00 mg/dL 1.18(H)  Sodium 135 - 145 mmol/L 141  Potassium 3.5 - 5.1 mmol/L 3.6  Chloride 98 - 111 mmol/L 103  CO2 22 - 32 mmol/L 26  Calcium 8.9 - 10.3 mg/dL 9.7  Total Protein 6.5 - 8.1 g/dL 7.8  Total Bilirubin 0.3 - 1.2 mg/dL 0.4  Alkaline Phos 38 - 126 U/L 64  AST 15 - 41 U/L 22  ALT 0 - 44 U/L 16     RADIOGRAPHIC STUDIES: I have personally reviewed the radiological images as listed and agreed with the findings in the report. No results found.  ASSESSMENT & PLAN:  Diana Mcknight is a 80 y.o. African American female with a  history of HTN and HLD   1. Right breast DCIS, grade II, ER/PR PENDING  -I discussed her breast imaging and needle biopsy results with patient and her family members in great detail. She has a 31mm mass in the right breast found to be DCIS with ductal papilloma.  -She is a candidate for breast conservation surgery. She has been seen by breast surgeon Dr. Marlou Starks, who recommends lumpectomy. -case reviewed in breast conference this morning, she is not a candidate for COMET trial  -Her DCIS will be cured by complete surgical resection. Any form of adjuvant therapy is preventive. -I reviewed her risk and treatment benefits using the Breast Cancer Nomogram from [facility]). Based on family, PMx and lifestyle she has a 13% risk of developing future breast cancer in the next 10 years. Her risk would drop to 5-6% with RT or Antiestrogen therapy alone. With both adjuvant treatments her risk would decrease to 2-3%.  -If her DCIS is strongly positive ER and PR, I may recommend antiestrogen therapy, which decrease her risk of future breast cancer by ~40%. I reviewed side effects with her. She will think about it. Will revisit after surgery.  -She may benefit from breast radiation if she undergo lumpectomy to decrease the risk of breast cancer. She will discuss this further with Dr Lisbeth Renshaw today. Overall she is not interested in radiation.  - However due to her advanced age, the absolute benefit of antiestrogen therapy is also small, and there is no survival benefit, it would be also reasonable to forgo any adjuvant treatment. -We also discussed that biopsy may have sampling limitation, we will review her surgical path, to see if she has any invasive carcinoma components. -We discussed breast cancer surveillance after she completes treatment, Including annual mammogram, breast exam every 6-12 months. -Labs reviewed today, CBC and CMP except WBC 3.8, ANC 1.4, BUN 27, Cr 1.18. No breast mass  palpable on exam. For her skin erythema under breasts, will prescribe her Nystatin powder.  -She will proceed with surgery. I will f/u with her afterward.    2. Bone Health  -She notes her PCP Dr Shelia Media has done her DEXA around 2020. I will request report.    3. Genetic Testing  -Her mother had uterine cancer and her brother had liver cancer and 1 cousin with  breast cancer. She is still interested in genetic testing even though her insurance will not cover it. I will send referral.     PLAN:   -Send genetic referral  -Proceed with surgery  -F/u after surgery  -Request last DEXA from her PCP  -I called in Nystatin Powder    No orders of the defined types were placed in this encounter.   All questions were answered. The patient knows to call the clinic with any problems, questions or concerns. The total time spent in the appointment was 45 minutes.     Truitt Merle, MD 07/17/2020 11:37 AM  I, Joslyn Devon, am acting as scribe for Truitt Merle, MD.   I have reviewed the above documentation for accuracy and completeness, and I agree with the above.

## 2020-07-17 NOTE — Progress Notes (Signed)
[place]  Initial Assessment   Diana Mcknight is a 80 y.o. year old female presenting alone. Clinical Social Work was referred by [facility] for assessment of psychosocial needs.   SDOH (Social Determinants of Health) assessments performed: Yes SDOH Interventions   Flowsheet Row Most Recent Value  SDOH Interventions   Food Insecurity Interventions Intervention Not Indicated  Housing Interventions Intervention Not Indicated  Transportation Interventions [facility]      Distress Screen completed: Yes ONCBCN DISTRESS SCREENING 07/17/2020  Screening Type Initial Screening  Distress experienced in past week (1-10) 0      Family/Social Information:  . Housing Arrangement: patient lives alone  In her own home . Family members/support persons in your life? Family (2 daughters, sister) . Transportation concerns: no, uses bus services and now connected with [facility] transportation  . Employment: Retired. Income source: Conservation officer, historic buildings . Financial concerns: No o Type of concern: None . Food access concerns: no . Services Currently in place:  n/a  Coping/ Adjustment to diagnosis: . Patient understands treatment plan and what happens next? yes . Concerns about diagnosis and/or treatment: I'm not especially worried about anything . Patient reported stressors: No particular stressors . Patient enjoys gardening and being outside . Current coping skills/ strengths: Active sense of humor, Capable of independent living, Motivation for treatment/growth and Supportive family/friends    SUMMARY: Current SDOH Barriers:  . No significant SDOH barriers noted today  Clinical Social Work Clinical Goal(s):  Marland Kitchen No clinical SW goals at this time  Interventions: . Discussed the importance of support during treatment . Informed patient of the support team roles and support services at [facility] . Provided CSW contact information and encouraged patient to call with any  questions or concerns  Follow Up Plan: Patient will contact CSW with any support or resource needs Patient verbalizes understanding of plan: Yes    Christeen Douglas , LCSW

## 2020-07-17 NOTE — Progress Notes (Signed)
Radiation Oncology         [phone] ________________________________  Name: Diana Mcknight        MRN: [id number]  Date of Service: 07/17/2020 DOB: 08-15-1940  UU:[id number], Thayer Jew, MD  Jovita Kussmaul, MD     REFERRING PHYSICIAN: Jovita Kussmaul, MD   DIAGNOSIS: The encounter diagnosis was Ductal carcinoma in situ (DCIS) of right breast.   HISTORY OF PRESENT ILLNESS: Diana Mcknight is a 80 y.o. female seen in the multidisciplinary breast clinic for a new diagnosis of right breast cancer. The patient was noted to have a screening detected mass in the right breast.  The patient reports a history of normal mammography.  Her mammogram showed a 6 mm mass in the 3 o'clock position of the right breast.  Further diagnostic imaging confirmed that there was no concern for adenopathy in the right axilla.  She underwent a biopsy on 10/07/2020 which revealed an intermediate grade DCIS within an intraductal papilloma.  She is seen today to discuss treatment recommendations of her cancer.    PREVIOUS RADIATION THERAPY: No   PAST MEDICAL HISTORY:  Past Medical History:  Diagnosis Date  . Asthma    childhood asthma  . Chronic cystitis   . Hx of adenomatous colonic polyps 02/24/2016  . Hyperlipidemia   . Hypertension        PAST SURGICAL HISTORY: Past Surgical History:  Procedure Laterality Date  . BLADDER SURGERY     fulguration  . COLONOSCOPY    . MYOMECTOMY    . WISDOM TOOTH EXTRACTION       FAMILY HISTORY:  Family History  Problem Relation Age of Onset  . Liver cancer Brother   . Colon cancer Neg Hx   . Esophageal cancer Neg Hx   . Pancreatic cancer Neg Hx   . Rectal cancer Neg Hx   . Stomach cancer Neg Hx      SOCIAL HISTORY:  reports that she has never smoked. She has never used smokeless tobacco. She reports that she does not drink alcohol and does not use drugs.   ALLERGIES: Erythromycin and Penicillins   MEDICATIONS:  Current Outpatient Medications  Medication  Sig Dispense Refill  . Cholecalciferol (VITAMIN D) 2000 units CAPS Take by mouth daily.    . hydrochlorothiazide (HYDRODIURIL) 25 MG tablet Take 25 mg by mouth daily.    Marland Kitchen losartan (COZAAR) 50 MG tablet Take 50 mg by mouth daily.     Current Facility-Administered Medications  Medication Dose Route Frequency Provider Last Rate Last Admin  . 0.9 %  sodium chloride infusion  500 mL Intravenous Continuous Gatha Mayer, MD         REVIEW OF SYSTEMS: On review of systems, the patient reports that she is doing well overall.  She states that she does have urinary hesitancy and at times episodes of incontinence.  She denies any specific breast complaints.    PHYSICAL EXAM:  Wt Readings from Last 3 Encounters:  11/08/18 199 lb 4.8 oz (90.4 kg)  02/19/16 202 lb (91.6 kg)  02/05/16 202 lb 9.6 oz (91.9 kg)   Temp Readings from Last 3 Encounters:  11/08/18 (!) 97.4 F (36.3 C)  02/19/16 (!) 96 F (35.6 C)   BP Readings from Last 3 Encounters:  11/08/18 113/75  02/19/16 106/66   Pulse Readings from Last 3 Encounters:  11/08/18 69  02/19/16 (!) 59    In general this is a well appearing African-American female in no acute distress. She's  alert and oriented x4 and appropriate throughout the examination. Cardiopulmonary assessment is negative for acute distress and she exhibits normal effort. Bilateral breast exam reveals grossly pendulous breast tissue, the remainder of the exam is otherwise deferred.   ECOG = 0  0 - Asymptomatic (Fully active, able to carry on all predisease activities without restriction)  1 - Symptomatic but completely ambulatory (Restricted in physically strenuous activity but ambulatory and able to carry out work of a light or sedentary nature. For example, light housework, office work)  2 - Symptomatic, <50% in bed during the day (Ambulatory and capable of all self care but unable to carry out any work activities. Up and about more than 50% of waking hours)  3 -  Symptomatic, >50% in bed, but not bedbound (Capable of only limited self-care, confined to bed or chair 50% or more of waking hours)  4 - Bedbound (Completely disabled. Cannot carry on any self-care. Totally confined to bed or chair)  5 - Death   Eustace Pen MM, Creech RH, Tormey DC, et al. [id number]). "Toxicity and response criteria of the [facility]". [place] Oncol. 5 (6): 649-55    LABORATORY DATA:  No results found for: WBC, HGB, HCT, MCV, PLT No results found for: NA, K, CL, CO2 No results found for: ALT, AST, GGT, ALKPHOS, BILITOT    RADIOGRAPHY: No results found.     IMPRESSION/PLAN: 1. Intermediate grade DCIS of the right breast, prognostics are pending at the time of dictation. Dr. Lisbeth Renshaw discusses the pathology findings and reviews the nature of noninvasive breast disease. The consensus from the breast conference includes breast conservation with lumpectomy.  Her prognostic markers are pending, but if her cancer was estrogen receptor positive, Dr. Burr Medico has recommended adjuvant antiestrogen therapy.  Dr. Lisbeth Renshaw discusses the typical utility for radiotherapy to reduce risks of local recurrence in the breast.  If she had generous margins however this could be an option depending on her prognostic markers and final results of surgery.  We discussed the risks, benefits, short, and long term effects of radiotherapy, as well as the curative intent, and the patient is interested in foregoing radiotherapy.  At the conclusion of our discussion we agreed that we would follow-up with the final results of surgery in conference but would most likely avoid radiotherapy unless there were high-grade features, positive margins, or if the patient changed her mind and chose to pursue aggressive adjuvant care.  In a visit lasting 60 minutes, greater than 50% of the time was spent face to face reviewing her case, as well as in preparation of, discussing, and coordinating the patient's  care.  The above documentation reflects my direct findings during this shared patient visit. Please see the separate note by Dr. Lisbeth Renshaw on this date for the remainder of the patient's plan of care.    Carola Rhine, [facility]    **Disclaimer: This note was dictated with voice recognition software. Similar sounding words can inadvertently be transcribed and this note may contain transcription errors which may not have been corrected upon publication of note.**

## 2020-07-18 ENCOUNTER — Encounter: Payer: Self-pay | Admitting: *Deleted

## 2020-07-18 ENCOUNTER — Telehealth: Payer: Self-pay | Admitting: Hematology

## 2020-07-18 ENCOUNTER — Telehealth: Payer: Self-pay | Admitting: Genetic Counselor

## 2020-07-18 NOTE — Telephone Encounter (Signed)
Checked out appointment. No LOS notes needing to be scheduled. No changes made. 

## 2020-07-18 NOTE — Telephone Encounter (Signed)
Called Diana Mcknight to discuss interest in genetic testing and set up genetics appt per Dr. Ernestina Penna note from [facility].  LVM with contact information requesting call back

## 2020-07-19 ENCOUNTER — Telehealth: Payer: Self-pay | Admitting: Hematology

## 2020-07-19 NOTE — Telephone Encounter (Signed)
Scheduled per sch msg. Called and spoke with patient. Confirmed appt  

## 2020-07-25 ENCOUNTER — Encounter: Payer: Self-pay | Admitting: *Deleted

## 2020-07-25 ENCOUNTER — Telehealth: Payer: Self-pay | Admitting: *Deleted

## 2020-07-25 ENCOUNTER — Encounter (HOSPITAL_BASED_OUTPATIENT_CLINIC_OR_DEPARTMENT_OTHER): Payer: Self-pay | Admitting: General Surgery

## 2020-07-25 ENCOUNTER — Other Ambulatory Visit: Payer: Self-pay

## 2020-07-25 NOTE — Telephone Encounter (Signed)
Spoke with patient to follow up from [facility] 3/23 and assess navigation needs.  Patient denies any questions or concerns at this time. Encouraged her to call should anything arise. Patient verbalized understanding.

## 2020-07-27 ENCOUNTER — Other Ambulatory Visit (HOSPITAL_COMMUNITY)
Admission: RE | Admit: 2020-07-27 | Discharge: 2020-07-27 | Disposition: A | Discharge status: Home / Self Care | Payer: Medicare Other | Source: Ambulatory Visit | Attending: General Surgery | Admitting: General Surgery

## 2020-07-27 DIAGNOSIS — Z01812 Encounter for preprocedural laboratory examination: Secondary | ICD-10-CM | POA: Insufficient documentation

## 2020-07-27 DIAGNOSIS — Z20822 Contact with and (suspected) exposure to covid-19: Secondary | ICD-10-CM | POA: Diagnosis not present

## 2020-07-27 LAB — SARS CORONAVIRUS 2 (TAT 6-24 HRS): SARS Coronavirus 2: NEGATIVE

## 2020-07-29 ENCOUNTER — Other Ambulatory Visit: Payer: Self-pay

## 2020-07-29 ENCOUNTER — Encounter (HOSPITAL_BASED_OUTPATIENT_CLINIC_OR_DEPARTMENT_OTHER)
Admission: RE | Admit: 2020-07-29 | Discharge: 2020-07-29 | Disposition: A | Discharge status: Home / Self Care | Payer: Medicare Other | Source: Ambulatory Visit | Attending: General Surgery | Admitting: General Surgery

## 2020-07-29 DIAGNOSIS — Z01818 Encounter for other preprocedural examination: Secondary | ICD-10-CM | POA: Diagnosis not present

## 2020-07-29 LAB — BASIC METABOLIC PANEL
Anion gap: 8 (ref 5–15)
BUN: 16 mg/dL (ref 8–23)
CO2: 30 mmol/L (ref 22–32)
Calcium: 9.8 mg/dL (ref 8.9–10.3)
Chloride: 103 mmol/L (ref 98–111)
Creatinine, Ser: 0.99 mg/dL (ref 0.44–1.00)
GFR, Estimated: 58 mL/min — ABNORMAL LOW (ref >60)
Glucose, Bld: 89 mg/dL (ref 70–99)
Potassium: 4.4 mmol/L (ref 3.5–5.1)
Sodium: 141 mmol/L (ref 135–145)

## 2020-07-29 NOTE — Progress Notes (Signed)

## 2020-07-30 DIAGNOSIS — D0511 Intraductal carcinoma in situ of right breast: Secondary | ICD-10-CM | POA: Diagnosis not present

## 2020-07-31 ENCOUNTER — Other Ambulatory Visit: Payer: Self-pay

## 2020-07-31 ENCOUNTER — Ambulatory Visit (HOSPITAL_BASED_OUTPATIENT_CLINIC_OR_DEPARTMENT_OTHER): Payer: Medicare Other | Admitting: Anesthesiology

## 2020-07-31 ENCOUNTER — Encounter (HOSPITAL_BASED_OUTPATIENT_CLINIC_OR_DEPARTMENT_OTHER): Payer: Self-pay | Admitting: General Surgery

## 2020-07-31 ENCOUNTER — Ambulatory Visit (HOSPITAL_BASED_OUTPATIENT_CLINIC_OR_DEPARTMENT_OTHER)
Admission: RE | Admit: 2020-07-31 | Discharge: 2020-07-31 | Disposition: A | Discharge status: Home / Self Care | Payer: Medicare Other | Attending: General Surgery | Admitting: General Surgery

## 2020-07-31 ENCOUNTER — Encounter (HOSPITAL_BASED_OUTPATIENT_CLINIC_OR_DEPARTMENT_OTHER)
Admission: RE | Disposition: A | Discharge status: Home / Self Care | Payer: Self-pay | Source: Home / Self Care | Attending: General Surgery

## 2020-07-31 DIAGNOSIS — Z79899 Other long term (current) drug therapy: Secondary | ICD-10-CM | POA: Insufficient documentation

## 2020-07-31 DIAGNOSIS — D241 Benign neoplasm of right breast: Secondary | ICD-10-CM | POA: Diagnosis not present

## 2020-07-31 DIAGNOSIS — D0511 Intraductal carcinoma in situ of right breast: Secondary | ICD-10-CM | POA: Insufficient documentation

## 2020-07-31 DIAGNOSIS — C50911 Malignant neoplasm of unspecified site of right female breast: Secondary | ICD-10-CM | POA: Diagnosis not present

## 2020-07-31 DIAGNOSIS — E785 Hyperlipidemia, unspecified: Secondary | ICD-10-CM | POA: Diagnosis not present

## 2020-07-31 DIAGNOSIS — I10 Essential (primary) hypertension: Secondary | ICD-10-CM | POA: Diagnosis not present

## 2020-07-31 DIAGNOSIS — N3020 Other chronic cystitis without hematuria: Secondary | ICD-10-CM | POA: Diagnosis not present

## 2020-07-31 HISTORY — PX: BREAST LUMPECTOMY WITH RADIOACTIVE SEED LOCALIZATION: SHX6424

## 2020-07-31 SURGERY — BREAST LUMPECTOMY WITH RADIOACTIVE SEED LOCALIZATION
Anesthesia: General | Site: Breast | Laterality: Right

## 2020-07-31 MED ORDER — ARTIFICIAL TEARS OPHTHALMIC OINT
TOPICAL_OINTMENT | OPHTHALMIC | Status: DC | PRN
  Administered 2020-07-31: 1 via OPHTHALMIC

## 2020-07-31 MED ORDER — LACTATED RINGERS IV SOLN: INTRAVENOUS | Status: DC

## 2020-07-31 MED ORDER — CHLORHEXIDINE GLUCONATE CLOTH 2 % EX PADS: 6.0000 | MEDICATED_PAD | Freq: Once | CUTANEOUS | Status: DC

## 2020-07-31 MED ORDER — FENTANYL CITRATE (PF) 100 MCG/2ML IJ SOLN
INTRAMUSCULAR | Status: AC
  Filled 2020-07-31: qty 2

## 2020-07-31 MED ORDER — GABAPENTIN 300 MG PO CAPS: 300.0000 mg | ORAL_CAPSULE | ORAL | Status: DC

## 2020-07-31 MED ORDER — DEXAMETHASONE SODIUM PHOSPHATE 10 MG/ML IJ SOLN
INTRAMUSCULAR | Status: AC
  Filled 2020-07-31: qty 1

## 2020-07-31 MED ORDER — GLYCOPYRROLATE 0.2 MG/ML IJ SOLN
INTRAMUSCULAR | Status: AC
  Filled 2020-07-31: qty 1

## 2020-07-31 MED ORDER — CELECOXIB 200 MG PO CAPS
200.0000 mg | ORAL_CAPSULE | ORAL | Status: AC
  Administered 2020-07-31: 200 mg via ORAL

## 2020-07-31 MED ORDER — AMISULPRIDE (ANTIEMETIC) 5 MG/2ML IV SOLN: 5.0000 mg | Freq: Once | INTRAVENOUS | Status: DC | PRN

## 2020-07-31 MED ORDER — LIDOCAINE 2% (20 MG/ML) 5 ML SYRINGE
INTRAMUSCULAR | Status: AC
  Filled 2020-07-31: qty 5

## 2020-07-31 MED ORDER — BUPIVACAINE-EPINEPHRINE (PF) 0.25% -1:200000 IJ SOLN
INTRAMUSCULAR | Status: DC | PRN
  Administered 2020-07-31: 20 mL

## 2020-07-31 MED ORDER — ACETAMINOPHEN 500 MG PO TABS
1000.0000 mg | ORAL_TABLET | ORAL | Status: AC
  Administered 2020-07-31: 1000 mg via ORAL

## 2020-07-31 MED ORDER — DEXAMETHASONE SODIUM PHOSPHATE 10 MG/ML IJ SOLN
INTRAMUSCULAR | Status: DC | PRN
  Administered 2020-07-31: 5 mg via INTRAVENOUS

## 2020-07-31 MED ORDER — HYDROCODONE-ACETAMINOPHEN 5-325 MG PO TABS: 1.0000 | ORAL_TABLET | Freq: Four times a day (QID) | ORAL | 0 refills | Status: AC | PRN

## 2020-07-31 MED ORDER — PROPOFOL 10 MG/ML IV BOLUS
INTRAVENOUS | Status: DC | PRN
  Administered 2020-07-31: 200 mg via INTRAVENOUS

## 2020-07-31 MED ORDER — ONDANSETRON HCL 4 MG/2ML IJ SOLN: 4.0000 mg | Freq: Once | INTRAMUSCULAR | Status: DC | PRN

## 2020-07-31 MED ORDER — FENTANYL CITRATE (PF) 100 MCG/2ML IJ SOLN: 25.0000 ug | INTRAMUSCULAR | Status: DC | PRN

## 2020-07-31 MED ORDER — PROPOFOL 10 MG/ML IV BOLUS
INTRAVENOUS | Status: AC
  Filled 2020-07-31: qty 20

## 2020-07-31 MED ORDER — ONDANSETRON HCL 4 MG/2ML IJ SOLN
INTRAMUSCULAR | Status: DC | PRN
  Administered 2020-07-31: 4 mg via INTRAVENOUS

## 2020-07-31 MED ORDER — FENTANYL CITRATE (PF) 100 MCG/2ML IJ SOLN
INTRAMUSCULAR | Status: DC | PRN
  Administered 2020-07-31 (×2): 25 ug via INTRAVENOUS

## 2020-07-31 MED ORDER — AMISULPRIDE (ANTIEMETIC) 5 MG/2ML IV SOLN
INTRAVENOUS | Status: DC | PRN
  Administered 2020-07-31: 5 mg via INTRAVENOUS

## 2020-07-31 MED ORDER — PHENYLEPHRINE 40 MCG/ML (10ML) SYRINGE FOR IV PUSH (FOR BLOOD PRESSURE SUPPORT)
PREFILLED_SYRINGE | INTRAVENOUS | Status: AC
  Filled 2020-07-31: qty 10

## 2020-07-31 MED ORDER — CELECOXIB 200 MG PO CAPS
ORAL_CAPSULE | ORAL | Status: AC
  Filled 2020-07-31: qty 1

## 2020-07-31 MED ORDER — CEFAZOLIN SODIUM-DEXTROSE 2-4 GM/100ML-% IV SOLN
2.0000 g | INTRAVENOUS | Status: AC
  Administered 2020-07-31: 2 g via INTRAVENOUS

## 2020-07-31 MED ORDER — LIDOCAINE HCL (CARDIAC) PF 100 MG/5ML IV SOSY
PREFILLED_SYRINGE | INTRAVENOUS | Status: DC | PRN
  Administered 2020-07-31: 60 mL via INTRATRACHEAL

## 2020-07-31 MED ORDER — ARTIFICIAL TEARS OPHTHALMIC OINT
TOPICAL_OINTMENT | OPHTHALMIC | Status: AC
  Filled 2020-07-31: qty 3.5

## 2020-07-31 MED ORDER — ONDANSETRON HCL 4 MG/2ML IJ SOLN
INTRAMUSCULAR | Status: AC
  Filled 2020-07-31: qty 2

## 2020-07-31 MED ORDER — ACETAMINOPHEN 500 MG PO TABS
ORAL_TABLET | ORAL | Status: AC
  Filled 2020-07-31: qty 2

## 2020-07-31 MED ORDER — GLYCOPYRROLATE 0.2 MG/ML IJ SOLN
INTRAMUSCULAR | Status: DC | PRN
  Administered 2020-07-31: .1 mg via INTRAVENOUS

## 2020-07-31 MED ORDER — CEFAZOLIN SODIUM-DEXTROSE 2-4 GM/100ML-% IV SOLN
INTRAVENOUS | Status: AC
  Filled 2020-07-31: qty 100

## 2020-07-31 MED ORDER — AMISULPRIDE (ANTIEMETIC) 5 MG/2ML IV SOLN
INTRAVENOUS | Status: AC
  Filled 2020-07-31: qty 2

## 2020-07-31 MED ORDER — PHENYLEPHRINE HCL (PRESSORS) 10 MG/ML IV SOLN
INTRAVENOUS | Status: DC | PRN
  Administered 2020-07-31 (×2): 120 ug via INTRAVENOUS
  Administered 2020-07-31: 80 ug via INTRAVENOUS

## 2020-07-31 SURGICAL SUPPLY — 38 items
ADH SKN CLS APL DERMABOND .7 (GAUZE/BANDAGES/DRESSINGS) ×1
APL PRP STRL LF DISP 70% ISPRP (MISCELLANEOUS) ×1
APPLIER CLIP 9.375 MED OPEN (MISCELLANEOUS) ×2
APR CLP MED 9.3 20 MLT OPN (MISCELLANEOUS) ×1
BLADE SURG 15 STRL LF DISP TIS (BLADE) ×1 IMPLANT
BLADE SURG 15 STRL SS (BLADE) ×2
CANISTER SUC SOCK COL 7IN (MISCELLANEOUS) ×2 IMPLANT
CANISTER SUCT 1200ML W/VALVE (MISCELLANEOUS) ×2 IMPLANT
CHLORAPREP W/TINT 26 (MISCELLANEOUS) ×2 IMPLANT
CLIP APPLIE 9.375 MED OPEN (MISCELLANEOUS) ×1 IMPLANT
COVER BACK TABLE 60X90IN (DRAPES) ×2 IMPLANT
COVER MAYO STAND STRL (DRAPES) ×2 IMPLANT
COVER PROBE W GEL 5X96 (DRAPES) ×2 IMPLANT
DERMABOND ADVANCED (GAUZE/BANDAGES/DRESSINGS) ×1
DERMABOND ADVANCED .7 DNX12 (GAUZE/BANDAGES/DRESSINGS) ×1 IMPLANT
DRAPE LAPAROSCOPIC ABDOMINAL (DRAPES) ×2 IMPLANT
DRAPE UTILITY XL STRL (DRAPES) ×2 IMPLANT
ELECT COATED BLADE 2.86 ST (ELECTRODE) ×2 IMPLANT
ELECT REM PT RETURN 9FT ADLT (ELECTROSURGICAL) ×2
ELECTRODE REM PT RTRN 9FT ADLT (ELECTROSURGICAL) ×1 IMPLANT
GLOVE SURG ENC MOIS LTX SZ6.5 (GLOVE) ×2 IMPLANT
GLOVE SURG ENC MOIS LTX SZ7.5 (GLOVE) ×4 IMPLANT
GOWN STRL REUS W/ TWL LRG LVL3 (GOWN DISPOSABLE) ×2 IMPLANT
GOWN STRL REUS W/TWL LRG LVL3 (GOWN DISPOSABLE) ×4
KIT MARKER MARGIN INK (KITS) ×2 IMPLANT
NEEDLE HYPO 25X1 1.5 SAFETY (NEEDLE) ×2 IMPLANT
PACK BASIN DAY SURGERY FS (CUSTOM PROCEDURE TRAY) ×2 IMPLANT
PENCIL SMOKE EVACUATOR (MISCELLANEOUS) ×2 IMPLANT
SLEEVE SCD COMPRESS KNEE MED (STOCKING) ×2 IMPLANT
SPONGE LAP 18X18 RF (DISPOSABLE) ×2 IMPLANT
SUT MON AB 4-0 PC3 18 (SUTURE) ×2 IMPLANT
SUT SILK 2 0 SH (SUTURE) IMPLANT
SUT VICRYL 3-0 CR8 SH (SUTURE) ×2 IMPLANT
SYR CONTROL 10ML LL (SYRINGE) ×2 IMPLANT
TOWEL GREEN STERILE FF (TOWEL DISPOSABLE) ×2 IMPLANT
TRAY FAXITRON CT DISP (TRAY / TRAY PROCEDURE) ×2 IMPLANT
TUBE CONNECTING 20X1/4 (TUBING) IMPLANT
YANKAUER SUCT BULB TIP NO VENT (SUCTIONS) IMPLANT

## 2020-07-31 NOTE — Anesthesia Procedure Notes (Signed)
Performed by: Collier Bullock, CRNA

## 2020-07-31 NOTE — Anesthesia Procedure Notes (Signed)
Procedure Name: LMA Insertion Date/Time: 07/31/2020 1:51 PM Performed by: Collier Bullock, CRNA Pre-anesthesia Checklist: Patient identified, Emergency Drugs available, Suction available and Patient being monitored Patient Re-evaluated:Patient Re-evaluated prior to induction Oxygen Delivery Method: Circle system utilized Preoxygenation: Pre-oxygenation with 100% oxygen Induction Type: IV induction Ventilation: Mask ventilation without difficulty LMA: LMA inserted LMA Size: 4.0 Number of attempts: 1 Placement Confirmation: positive ETCO2 and breath sounds checked- equal and bilateral Tube secured with: Tape Dental Injury: Teeth and Oropharynx as per pre-operative assessment

## 2020-07-31 NOTE — H&P (Signed)
Diana Mcknight  Location: [facility] Surgery Patient #: [id number] DOB: [birth year] Undefined / Language: [name] / Race: White Female   History of Present Illness  The patient is a 80 year old female who presents with breast cancer.We are asked to see the patient in consultation by Dr. Burr Medico to evaluate her for a new right breast cancer. The patient is a 80 year old black female who presents with a 54mm mass in the inner right breast. The axilla looked neg. This was biopsied and came back as intermediate grade DCIS. She is in good health and does not smoke.   Past Surgical History  Hemorrhoidectomy   Diagnostic Studies History  Colonoscopy  5-10 years ago Mammogram  within last year Pap Smear  >5 years ago  Medication History  Medications Reconciled  Social History  Caffeine use  Coffee, Tea. No alcohol use  No drug use  Tobacco use  Never smoker.  Family History  Arthritis  Mother. Cancer  Brother. Cervical Cancer  Mother. Depression  Mother. Hypertension  Father.  Pregnancy / Birth History  Age at menarche  66 years. Age of menopause  68-60 Gravida  2 Length (months) of breastfeeding  3-6 Maternal age  38-35 Para  2  Other Problems  Bladder Problems  Gastroesophageal Reflux Disease  Hemorrhoids  High blood pressure  Hypercholesterolemia     Review of Systems  General Not Present- Appetite Loss, Chills, Fatigue, Fever, Night Sweats, Weight Gain and Weight Loss. Skin Not Present- Change in Wart/Mole, Dryness, Hives, Jaundice, New Lesions, Non-Healing Wounds, Rash and Ulcer. HEENT Present- Seasonal Allergies. Not Present- Earache, Hearing Loss, Hoarseness, Nose Bleed, Oral Ulcers, Ringing in the Ears, Sinus Pain, Sore Throat, Visual Disturbances, Wears glasses/contact lenses and Yellow Eyes. Respiratory Present- Snoring. Not Present- Bloody sputum, Chronic Cough, Difficulty Breathing and Wheezing. Breast Not Present- Breast Mass,  Breast Pain, Nipple Discharge and Skin Changes. Cardiovascular Not Present- Chest Pain, Difficulty Breathing Lying Down, Leg Cramps, Palpitations, Rapid Heart Rate, Shortness of Breath and Swelling of Extremities. Gastrointestinal Not Present- Abdominal Pain, Bloating, Bloody Stool, Change in Bowel Habits, Chronic diarrhea, Constipation, Difficulty Swallowing, Excessive gas, Gets full quickly at meals, Hemorrhoids, Indigestion, Nausea, Rectal Pain and Vomiting. Female Genitourinary Present- Frequency. Not Present- Nocturia, Painful Urination, Pelvic Pain and Urgency. Musculoskeletal Not Present- Back Pain, Joint Pain, Joint Stiffness, Muscle Pain, Muscle Weakness and Swelling of Extremities. Neurological Not Present- Decreased Memory, Fainting, Headaches, Numbness, Seizures, Tingling, Tremor, Trouble walking and Weakness. Psychiatric Not Present- Anxiety, Bipolar, Change in Sleep Pattern, Depression, Fearful and Frequent crying. Endocrine Not Present- Cold Intolerance, Excessive Hunger, Hair Changes, Heat Intolerance, Hot flashes and New Diabetes. Hematology Not Present- Blood Thinners, Easy Bruising, Excessive bleeding, Gland problems, HIV and Persistent Infections.   Physical Exam  General Mental Status-Alert. General Appearance-Consistent with stated age. Hydration-Well hydrated. Voice-Normal.  Head and Neck Head-normocephalic, atraumatic with no lesions or palpable masses. Trachea-midline. Thyroid Gland Characteristics - normal size and consistency.  Eye Eyeball - Bilateral-Extraocular movements intact. Sclera/Conjunctiva - Bilateral-No scleral icterus.  Chest and Lung Exam Chest and lung exam reveals -quiet, even and easy respiratory effort with no use of accessory muscles and on auscultation, normal breath sounds, no adventitious sounds and normal vocal resonance. Inspection Chest Wall - Normal. Back - normal.  Breast Note: there is no palpable mass in  either breast. there is no palpable axillary, supraclavicular, or cervical lymphadenopathy   Cardiovascular Cardiovascular examination reveals -normal heart sounds, regular rate and rhythm with no murmurs and normal  pedal pulses bilaterally.  Abdomen Inspection Inspection of the abdomen reveals - No Hernias. Skin - Scar - no surgical scars. Palpation/Percussion Palpation and Percussion of the abdomen reveal - Soft, Non Tender, No Rebound tenderness, No Rigidity (guarding) and No hepatosplenomegaly. Auscultation Auscultation of the abdomen reveals - Bowel sounds normal.  Neurologic Neurologic evaluation reveals -alert and oriented x 3 with no impairment of recent or remote memory. Mental Status-Normal.  Musculoskeletal Normal Exam - Left-Upper Extremity Strength Normal and Lower Extremity Strength Normal. Normal Exam - Right-Upper Extremity Strength Normal and Lower Extremity Strength Normal.  Lymphatic Head & Neck  General Head & Neck Lymphatics: Bilateral - Description - Normal. Axillary  General Axillary Region: Bilateral - Description - Normal. Tenderness - Non Tender. Femoral & Inguinal  Generalized Femoral & Inguinal Lymphatics: Bilateral - Description - Normal. Tenderness - Non Tender.    Assessment & Plan  DUCTAL CARCINOMA IN SITU (DCIS) OF RIGHT BREAST (D05.11) Impression: The patient appears to have a small area of dcis in the inner right breast. I have discussed with her the different options for treatment and at this point she favors breast conservation which I feel is very reasonable. She will not need a node evaluation. I have discussed with her in detail the risks and benefits of the surgery as well as some of the technical aspects including the use of a radioactive seed and she understands and wishes to proceed. She will also meet with medical and radiation oncology to discuss adjuvant therapy. This patient encounter took 60 minutes today to perform the  following: take history, perform exam, review outside records, interpret imaging, counsel the patient on their diagnosis and document encounter, findings & plan in the EHR Current Plans Referred to Oncology, for evaluation and follow up (Oncology). Routine.

## 2020-07-31 NOTE — Op Note (Signed)
07/31/2020  2:39 PM  PATIENT:  Diana Mcknight  80 y.o. female  PRE-OPERATIVE DIAGNOSIS:  RIGHT BREAST DCIS  POST-OPERATIVE DIAGNOSIS:  RIGHT BREAST DCIS  PROCEDURE:  Procedure(s): RIGHT BREAST LUMPECTOMY WITH RADIOACTIVE SEED LOCALIZATION (Right)  SURGEON:  Surgeon(s) and Role:    * Jovita Kussmaul, MD - Primary  PHYSICIAN ASSISTANT:   ASSISTANTS: none   ANESTHESIA:   local and general  EBL:  10 mL   BLOOD ADMINISTERED:none  DRAINS: none   LOCAL MEDICATIONS USED:  MARCAINE     SPECIMEN:  Source of Specimen:  right breast tissue  DISPOSITION OF SPECIMEN:  PATHOLOGY  COUNTS:  YES  TOURNIQUET:  * No tourniquets in log *  DICTATION: .Dragon Dictation   After informed consent was obtained the patient was brought to the operating room and placed in the supine position on the operating table.  After adequate induction of general anesthesia the patient's right breast was prepped with ChloraPrep, allowed to dry, and draped in usual sterile manner.  An appropriate timeout was performed.  Previously an I-125 seed was placed in the medial aspect of the right breast to mark an area of ductal carcinoma in situ.  The neoprobe was set to I-125 in the area of radioactivity was readily identified centrally in the medial upper right breast.  The area around this was infiltrated with quarter percent Marcaine.  A curvilinear incision was made along the upper inner edge of the areola of the right breast with a 15 blade knife.  The incision was carried through the skin and subcutaneous tissue sharply with the electrocautery.  Dissection was then carried towards the radioactive seed under the direction of the neoprobe.  Once I more closely approached the radioactive seed I then removed a circular portion of breast tissue sharply with the electrocautery around the radioactive seed while checking the area of radioactivity frequently.  Once the specimen was removed it was oriented with the appropriate  paint colors.  A specimen radiograph was obtained that showed the clip and seed to be near the center of the specimen.  The specimen was then sent to pathology for further evaluation.  Hemostasis was achieved using the Bovie electrocautery.  The wound was irrigated with saline and infiltrated with more quarter percent Marcaine.  The cavity was marked with clips.  The deep layer of the wound was then closed with layers of interrupted 3-0 Vicryl stitches.  The skin was then closed with interrupted 4-0 Monocryl subcuticular stitches.  Dermabond dressings were applied.  The patient tolerated the procedure well.  At the end of the case all needle sponge and instrument counts were correct.  Patient was then awakened and taken to recovery in stable condition.  PLAN OF CARE: Discharge to home after PACU  PATIENT DISPOSITION:  PACU - hemodynamically stable.   Delay start of Pharmacological VTE agent (>24hrs) due to surgical blood loss or risk of bleeding: not applicable

## 2020-07-31 NOTE — Progress Notes (Signed)
Pt had not arranged for 24 hr care after surgery. Diana Mcknight "my daughter will check on me" and after discussion said "my daughter Buffy will stay with me." Called daughter, Buffy, who didn't know anything about the surgery. Took patient the facility cell phone and let her call her daughter. Daughter was not aware that patient was having surgery but patient explained and daughter will come to her house and provide care.

## 2020-07-31 NOTE — Anesthesia Preprocedure Evaluation (Addendum)
Anesthesia Evaluation  Patient identified by MRN, date of birth, ID band Patient awake    Reviewed: Allergy & Precautions, NPO status , Patient's Chart, lab work & pertinent test results  Airway Mallampati: II  TM Distance: >3 FB Neck ROM: Full    Dental no notable dental hx.    Pulmonary asthma (childhood) ,    Pulmonary exam normal breath sounds clear to auscultation       Cardiovascular hypertension, Pt. on medications Normal cardiovascular exam Rhythm:Regular Rate:Normal  ECG: NSR, rate 68   Neuro/Psych negative neurological ROS  negative psych ROS   GI/Hepatic negative GI ROS, Neg liver ROS,   Endo/Other  Morbid obesity  Renal/GU negative Renal ROS     Musculoskeletal negative musculoskeletal ROS (+)   Abdominal (+) + obese,   Peds  Hematology negative hematology ROS (+)   Anesthesia Other Findings RIGHT BREAST DCIS  Reproductive/Obstetrics                            Anesthesia Physical Anesthesia Plan  ASA: III  Anesthesia Plan: General   Post-op Pain Management:    Induction: Intravenous  PONV Risk Score and Plan: 3 and Ondansetron, Dexamethasone, Treatment may vary due to age or medical condition, Propofol infusion and Amisulpride  Airway Management Planned: LMA  Additional Equipment:   Intra-op Plan:   Post-operative Plan: Extubation in OR  Informed Consent: I have reviewed the patients History and Physical, chart, labs and discussed the procedure including the risks, benefits and alternatives for the proposed anesthesia with the patient or authorized representative who has indicated his/her understanding and acceptance.     Dental advisory given  Plan Discussed with: CRNA  Anesthesia Plan Comments:         Anesthesia Quick Evaluation

## 2020-07-31 NOTE — Discharge Instructions (Signed)
  Post Anesthesia Home Care Instructions  Activity: Get plenty of rest for the remainder of the day. A responsible individual must stay with you for 24 hours following the procedure.  For the next 24 hours, DO NOT: -Drive a car -Paediatric nurse -Drink alcoholic beverages -Take any medication unless instructed by your physician -Make any legal decisions or sign important papers.  Meals: Start with liquid foods such as gelatin or soup. Progress to regular foods as tolerated. Avoid greasy, spicy, heavy foods. If nausea and/or vomiting occur, drink only clear liquids until the nausea and/or vomiting subsides. Call your physician if vomiting continues.  Special Instructions/Symptoms: Your throat may feel dry or sore from the anesthesia or the breathing tube placed in your throat during surgery. If this causes discomfort, gargle with warm salt water. The discomfort should disappear within 24 hours.  If you had a scopolamine patch placed behind your ear for the management of post- operative nausea and/or vomiting:  1. The medication in the patch is effective for 72 hours, after which it should be removed.  Wrap patch in a tissue and discard in the trash. Wash hands thoroughly with soap and water. 2. You may remove the patch earlier than 72 hours if you experience unpleasant side effects which may include dry mouth, dizziness or visual disturbances. 3. Avoid touching the patch. Wash your hands with soap and water after contact with the patch.  No Tylenol or Ibuprofen until 6:40PM.

## 2020-07-31 NOTE — Transfer of Care (Signed)
Immediate Anesthesia Transfer of Care Note  Patient: Diana Mcknight  Procedure(s) Performed: RIGHT BREAST LUMPECTOMY WITH RADIOACTIVE SEED LOCALIZATION (Right Breast)  Patient Location: PACU  Anesthesia Type:General  Level of Consciousness: awake, oriented and patient cooperative  Airway & Oxygen Therapy: Patient Spontanous Breathing and Patient connected to face mask oxygen  Post-op Assessment: Report given to RN, Post -op Vital signs reviewed and stable, Patient moving all extremities X 4 and Patient able to stick tongue midline  Post vital signs: Reviewed and stable  Last Vitals:  Vitals Value Taken Time  BP    Temp    Pulse 69 07/31/20 1444  Resp 14 07/31/20 1444  SpO2 100 % 07/31/20 1444  Vitals shown include unvalidated device data.  Last Pain:  Vitals:   07/31/20 1231  TempSrc: Oral  PainSc: 0-No pain         Complications: No complications documented.

## 2020-08-01 ENCOUNTER — Encounter (HOSPITAL_BASED_OUTPATIENT_CLINIC_OR_DEPARTMENT_OTHER): Payer: Self-pay | Admitting: General Surgery

## 2020-08-01 NOTE — Anesthesia Postprocedure Evaluation (Signed)
Anesthesia Post Note  Patient: Diana Mcknight  Procedure(s) Performed: RIGHT BREAST LUMPECTOMY WITH RADIOACTIVE SEED LOCALIZATION (Right Breast)     Patient location during evaluation: PACU Anesthesia Type: General Level of consciousness: awake Pain management: pain level controlled Vital Signs Assessment: post-procedure vital signs reviewed and stable Respiratory status: spontaneous breathing, nonlabored ventilation, respiratory function stable and patient connected to nasal cannula oxygen Cardiovascular status: blood pressure returned to baseline and stable Postop Assessment: no apparent nausea or vomiting Anesthetic complications: no   No complications documented.  Last Vitals:  Vitals:   07/31/20 1500 07/31/20 1539  BP: 127/83 139/86  Pulse: 72 67  Resp: 16 18  Temp:  (!) 36.4 C  SpO2: 98% 97%    Last Pain:  Vitals:   07/31/20 1539  TempSrc: Oral  PainSc: 0-No pain                 Konnor Vondrasek P Emoni Whitworth

## 2020-08-02 LAB — SURGICAL PATHOLOGY

## 2020-08-06 ENCOUNTER — Encounter: Payer: Self-pay | Admitting: *Deleted

## 2020-08-12 ENCOUNTER — Telehealth: Payer: Self-pay | Admitting: Hematology

## 2020-08-12 NOTE — Telephone Encounter (Signed)
Scheduled appt per 4/18 sch msg. Pt aware.

## 2020-08-21 ENCOUNTER — Ambulatory Visit: Payer: Medicare Other | Admitting: Hematology

## 2020-08-22 ENCOUNTER — Inpatient Hospital Stay: Payer: Medicare Other | Attending: Hematology | Admitting: Hematology

## 2020-08-22 ENCOUNTER — Encounter: Payer: Self-pay | Admitting: Hematology

## 2020-08-22 ENCOUNTER — Other Ambulatory Visit: Payer: Self-pay

## 2020-08-22 VITALS — BP 161/92 | HR 77 | Temp 97.6°F | Resp 20 | Ht 59.0 in | Wt 204.8 lb

## 2020-08-22 DIAGNOSIS — Z79899 Other long term (current) drug therapy: Secondary | ICD-10-CM | POA: Insufficient documentation

## 2020-08-22 DIAGNOSIS — I10 Essential (primary) hypertension: Secondary | ICD-10-CM | POA: Insufficient documentation

## 2020-08-22 DIAGNOSIS — J45909 Unspecified asthma, uncomplicated: Secondary | ICD-10-CM | POA: Insufficient documentation

## 2020-08-22 DIAGNOSIS — Z86010 Personal history of colonic polyps: Secondary | ICD-10-CM | POA: Insufficient documentation

## 2020-08-22 DIAGNOSIS — Z170 Estrogen receptor positive status [ER+]: Secondary | ICD-10-CM | POA: Diagnosis not present

## 2020-08-22 DIAGNOSIS — E785 Hyperlipidemia, unspecified: Secondary | ICD-10-CM | POA: Insufficient documentation

## 2020-08-22 DIAGNOSIS — D0511 Intraductal carcinoma in situ of right breast: Secondary | ICD-10-CM | POA: Insufficient documentation

## 2020-08-22 NOTE — Progress Notes (Signed)
[place]   [phone] [phone]) [phone]   Clinic Follow up Note   Patient Care Team: Deland Pretty, MD as PCP - General (Internal Medicine) Rockwell Germany, RN as Oncology Nurse Navigator Mauro Kaufmann, RN as Oncology Nurse Navigator Truitt Merle, MD as Consulting Physician (Hematology) Jovita Kussmaul, MD as Consulting Physician (General Surgery) Kyung Rudd, MD as Consulting Physician (Radiation Oncology)  Date of Service:  08/22/2020  CHIEF COMPLAINT: f/u of right breast DCIS  SUMMARY OF ONCOLOGIC HISTORY: Oncology History Overview Note  Cancer Staging Ductal carcinoma in situ (DCIS) of right breast Staging form: Breast, AJCC 8th Edition - Clinical stage from 07/10/2020: Stage 0 (cTis (DCIS), cN0, cM0, G2, ER+, PR+) - Signed by Truitt Merle, MD on 07/16/2020 Stage prefix: Initial diagnosis Histologic grading system: 3 grade system    Ductal carcinoma in situ (DCIS) of right breast  06/17/2020 Mammogram   The 0.6x0.6x0.5cm oval mass in the 3:00 position of the middle depth right breast, 4cm from the nipple is suspicious of malignancy. An US guided biopsy is recommended.    07/10/2020 Cancer Staging   Staging form: Breast, AJCC 8th Edition - Clinical stage from 07/10/2020: Stage 0 (cTis (DCIS), cN0, cM0, G2, ER+, PR+) - Signed by Truitt Merle, MD on 07/16/2020 Stage prefix: Initial diagnosis Histologic grading system: 3 grade system   07/10/2020 Initial Biopsy   Diagnosis Breast, right, needle core biopsy, 3:00 o'clock, 4 cmfn - DUCTAL CARCINOMA IN SITU INVOLVING A DUCTAL PAPILLOMA. Microscopic Comment E-cadherin is positive. CK5/6 is negative. P63, Calponin and SMM-1 demonstrate the presence of myoepithelium. The DCIS is of intermediate nuclear grade. Estrogen Receptor: 100%, POSITIVE, STRONG STAINING INTENSITY Progesterone Receptor: 95%, POSITIVE, STRONG STAINING INTENSITY   07/12/2020 Initial Diagnosis   Ductal carcinoma in situ (DCIS) of right  breast   07/31/2020 Cancer Staging   Staging form: Breast, AJCC 8th Edition - Pathologic stage from 07/31/2020: Stage Unknown (pTis (DCIS), pNX, G2, ER+, PR+, HER2: Not Assessed) - Signed by Truitt Merle, MD on 08/21/2020 Nuclear grade: G2 Histologic grading system: 3 grade system Residual tumor (R): R0 - None   07/31/2020 Surgery   A. BREAST, RIGHT, LUMPECTOMY:  - Ductal carcinoma in situ involving intraductal papilloma, 0.7 cm  - No evidence of invasive carcinoma  - Resection margins are negative for DCIS  - Biopsy site changes       CURRENT THERAPY:  Surveillance  INTERVAL HISTORY:  Diana Mcknight is here for a follow up of DCIS. She was last seen by me on 07/17/20 in the multidisciplinary breast clinic. She presents to the clinic accompanied by her daughter, who is a Engineer, water that teaches at [employer]. She notes she say Dr. Marlou Starks this past week. She has not seen Dr. Lisbeth Renshaw since surgery. She expressed interest in brachytherapy today.  All other systems were reviewed with the patient and are negative.  MEDICAL HISTORY:  Past Medical History:  Diagnosis Date  . Asthma    childhood asthma  . Chronic cystitis   . Hx of adenomatous colonic polyps 02/24/2016  . Hyperlipidemia   . Hypertension     SURGICAL HISTORY: Past Surgical History:  Procedure Laterality Date  . BLADDER SURGERY     fulguration  . BREAST LUMPECTOMY WITH RADIOACTIVE SEED LOCALIZATION Right 07/31/2020   Procedure: RIGHT BREAST LUMPECTOMY WITH RADIOACTIVE SEED LOCALIZATION;  Surgeon: Jovita Kussmaul, MD;  Location: [facility];  Service: General;  Laterality: Right;  . COLONOSCOPY    . MYOMECTOMY    .  WISDOM TOOTH EXTRACTION      I have reviewed the social history and family history with the patient and they are unchanged from previous note.  ALLERGIES:  is allergic to erythromycin and penicillins.  MEDICATIONS:  Current Outpatient Medications  Medication Sig Dispense Refill  . Cholecalciferol  (VITAMIN D) 2000 units CAPS Take by mouth daily.    . hydrochlorothiazide (HYDRODIURIL) 25 MG tablet Take 25 mg by mouth daily.    Marland Kitchen HYDROcodone-acetaminophen (NORCO/VICODIN) 5-325 MG tablet Take 1-2 tablets by mouth every 6 (six) hours as needed for moderate pain or severe pain. 10 tablet 0  . losartan (COZAAR) 50 MG tablet Take 50 mg by mouth daily.    Marland Kitchen nystatin (MYCOSTATIN/NYSTOP) powder Apply 1 application topically 3 (three) times daily. 60 g 1   No current facility-administered medications for this visit.    PHYSICAL EXAMINATION: ECOG PERFORMANCE STATUS: 0 - Asymptomatic  Vitals:   08/22/20 0931  BP: (!) 161/92  Pulse: 77  Resp: 20  Temp: 97.6 F (36.4 C)  SpO2: 98%   Filed Weights   08/22/20 0931  Weight: 204 lb 12.8 oz (92.9 kg)    Due to COVID19 we will limit examination to appearance. Patient had no complaints.  GENERAL:alert, no distress and comfortable SKIN: skin color normal, no rashes or significant lesions EYES: normal, Conjunctiva are pink and non-injected, sclera clear  NEURO: alert & oriented x 3 with fluent speech  LABORATORY DATA:  I have reviewed the data as listed CBC Latest Ref Rng & Units 07/17/2020  WBC 4.0 - 10.5 K/uL 3.8(L)  Hemoglobin 12.0 - 15.0 g/dL 12.9  Hematocrit 36.0 - 46.0 % 40.8  Platelets 150 - 400 K/uL 231     CMP Latest Ref Rng & Units 07/29/2020 07/17/2020  Glucose 70 - 99 mg/dL 89 98  BUN 8 - 23 mg/dL 16 27(H)  Creatinine 0.44 - 1.00 mg/dL 0.99 1.18(H)  Sodium 135 - 145 mmol/L 141 141  Potassium 3.5 - 5.1 mmol/L 4.4 3.6  Chloride 98 - 111 mmol/L 103 103  CO2 22 - 32 mmol/L 30 26  Calcium 8.9 - 10.3 mg/dL 9.8 9.7  Total Protein 6.5 - 8.1 g/dL - 7.8  Total Bilirubin 0.3 - 1.2 mg/dL - 0.4  Alkaline Phos 38 - 126 U/L - 64  AST 15 - 41 U/L - 22  ALT 0 - 44 U/L - 16      RADIOGRAPHIC STUDIES: I have personally reviewed the radiological images as listed and agreed with the findings in the report. No results found.    ASSESSMENT & PLAN:  Diana Mcknight is a 80 y.o. female with   1. Right breast DCIS, grade II, ER+/PR+ -Biopsy on 07/10/20 showed DCIS with ductal papilloma, ER+/PR+ -Right lumpectomy on 07/31/20 showed DCIS involving intraductal papilloma, 0.7 cm. I discussed her pathology results with the patient and her daughter today. -I reviewed that her DCIS will be cured by complete surgical resection. Any form of adjuvant therapy is preventive. -I again reviewed her risk and treatment benefits using the Breast Cancer Nomogram from [facility]). Based on family, PMx, and lifestyle she has a 13% risk of developing future breast cancer in the next 10 years. Her risk would drop to 5-6% with RT or Antiestrogen therapy alone. With both adjuvant treatments her risk would decrease to 2-3%.  -Given that her DCIS is strongly ER positive, she can elect to take antiestrogen therapy for risk reduction. We discussed tamoxifen and anastrozole, the  potential side effects. -She can also consider adjuvant radiation. She is interested in brachytherapy.  I will refer her back to Dr. Lisbeth Renshaw to further discuss this. - However due to her advanced age, the absolute benefit of antiestrogen therapy and or radiation is also small, and there is no survival benefit, it would be also reasonable to forgo any adjuvant treatment. She is not interested in IMRT or in taking pills in general. I recommend she undergo either radiation therapy for antiestrogen therapy. If she chooses neither, I would recommend surveillance with alternating annual MRI and mammogram.  We discussed breast cancer screening towards extensively, and I printed out to articles for her to read. -She is leaning towards surveillance today, but she is still interested in follow up with Dr. Lisbeth Renshaw.  2. Bone Health  -Bone density from 12/2018 showed T-score of -0.9, which is normal  3. Genetic Testing  -Her mother had uterine cancer and her brother  had liver cancer and 1 cousin with breast cancer. She is still interested in genetic testing even though her insurance will not cover it.    PLAN:   -will refer her back to Dr. Lisbeth Renshaw -she will call me for follow up as needed  -I ordered her first screening breast MRI today to be done in August     No problem-specific Assessment & Plan notes found for this encounter.   Orders Placed This Encounter  Procedures  . MR BREAST BILATERAL W WO CONTRAST INC CAD    Standing Status:   Future    Standing Expiration Date:   08/22/2021    Order Specific Question:   If indicated for the ordered procedure, I authorize the administration of contrast media per Radiology protocol    Answer:   Yes    Order Specific Question:   What is the patient's sedation requirement?    Answer:   No Sedation    Order Specific Question:   Does the patient have a pacemaker or implanted devices?    Answer:   No    Order Specific Question:   Radiology Contrast Protocol - do NOT remove file path    Answer:   [email or web address]    Order Specific Question:   Preferred imaging location?    Answer:   [facility] (table limit-550lbs)   All questions were answered. The patient knows to call the clinic with any problems, questions or concerns. No barriers to learning was detected. The total time spent in the appointment was 40 minutes.  Patient and her daughter had many questions, I answered to the best of my knowledge.     Truitt Merle, MD 08/22/2020   I, Wilburn Mylar, am acting as scribe for Truitt Merle, MD.   I have reviewed the above documentation for accuracy and completeness, and I agree with the above.

## 2020-08-27 ENCOUNTER — Encounter: Payer: Self-pay | Admitting: *Deleted

## 2020-08-27 DIAGNOSIS — D0511 Intraductal carcinoma in situ of right breast: Secondary | ICD-10-CM

## 2020-08-29 ENCOUNTER — Encounter: Payer: Self-pay | Admitting: Radiation Oncology

## 2020-09-02 ENCOUNTER — Encounter: Payer: Self-pay | Admitting: *Deleted

## 2020-09-03 ENCOUNTER — Encounter: Payer: Self-pay | Admitting: *Deleted

## 2020-09-03 ENCOUNTER — Ambulatory Visit
Admission: RE | Admit: 2020-09-03 | Discharge: 2020-09-03 | Disposition: A | Discharge status: Home / Self Care | Payer: Medicare Other | Source: Ambulatory Visit | Attending: Radiation Oncology | Admitting: Radiation Oncology

## 2020-09-03 DIAGNOSIS — D0511 Intraductal carcinoma in situ of right breast: Secondary | ICD-10-CM

## 2020-09-03 NOTE — Progress Notes (Signed)
Radiation Oncology         [phone] ________________________________  Outpatient Follow Up - Conducted via telephone due to current COVID-19 concerns for limiting patient exposure  I spoke with the patient to conduct this consult visit via telephone to spare the patient unnecessary potential exposure in the healthcare setting during the current COVID-19 pandemic. The patient was notified in advance and was offered a [place] meeting to allow for face to face communication but unfortunately reported that they did not have the appropriate resources/technology to support such a visit and instead preferred to proceed with a telephone visit.  Name: Diana Mcknight        MRN: [id number]  Date of Service: 09/03/2020 DOB: [birth year]  HE:NIDPO, Thayer Jew, MD  Truitt Merle, MD     REFERRING PHYSICIAN: Truitt Merle, MD   DIAGNOSIS: The encounter diagnosis was Ductal carcinoma in situ (DCIS) of right breast.   HISTORY OF PRESENT ILLNESS: Diana Mcknight is a 80 y.o. female originally seen in the multidisciplinary breast clinic for a new diagnosis of right breast cancer. The patient was noted to have a screening detected mass in the right breast.  The patient reports a history of normal mammography.  Her mammogram showed a 6 mm mass in the 3 o'clock position of the right breast.  Further diagnostic imaging confirmed that there was no concern for adenopathy in the right axilla.  She underwent a biopsy on 10/07/2020 which revealed an intermediate grade DCIS within an intraductal papilloma, and her cancer was ER/PR positive.  Since her last visit, the patient has undergone right lumpectomy on 07/31/2020, final pathology revealed ductal carcinoma in situ that was intermediate grade measuring up to 7 mm with an intraductal papilloma, no invasive disease was identified, her margins were widely clear with the closest being 8 mm in the superior region.  We had discussed options of adjuvant radiotherapy when we last met the  patient, and while she has the possibility of foregoing treatment was still interested in adjuvant therapy during our conversation last time.  She is contacted today by telephone to discuss how she would like to proceed.    PREVIOUS RADIATION THERAPY: No   PAST MEDICAL HISTORY:  Past Medical History:  Diagnosis Date  . Asthma    childhood asthma  . Chronic cystitis   . Hx of adenomatous colonic polyps 02/24/2016  . Hyperlipidemia   . Hypertension        PAST SURGICAL HISTORY: Past Surgical History:  Procedure Laterality Date  . BLADDER SURGERY     fulguration  . BREAST LUMPECTOMY WITH RADIOACTIVE SEED LOCALIZATION Right 07/31/2020   Procedure: RIGHT BREAST LUMPECTOMY WITH RADIOACTIVE SEED LOCALIZATION;  Surgeon: Jovita Kussmaul, MD;  Location: [facility];  Service: General;  Laterality: Right;  . COLONOSCOPY    . MYOMECTOMY    . WISDOM TOOTH EXTRACTION       FAMILY HISTORY:  Family History  Problem Relation Age of Onset  . Cervical cancer Mother   . Liver cancer Brother   . Breast cancer Cousin   . Colon cancer Neg Hx   . Esophageal cancer Neg Hx   . Pancreatic cancer Neg Hx   . Rectal cancer Neg Hx   . Stomach cancer Neg Hx      SOCIAL HISTORY:  reports that she has never smoked. She has never used smokeless tobacco. She reports that she does not drink alcohol and does not use drugs. The patient is widowed and lives in  [place]. Her daughter Sharyon Medicus helps her with medical decision making too. She enjoys gardening.    ALLERGIES: Erythromycin and Penicillins   MEDICATIONS:  Current Outpatient Medications  Medication Sig Dispense Refill  . Cholecalciferol (VITAMIN D) 2000 units CAPS Take by mouth daily.    . hydrochlorothiazide (HYDRODIURIL) 25 MG tablet Take 25 mg by mouth daily.    Marland Kitchen losartan (COZAAR) 50 MG tablet Take 50 mg by mouth daily.    Marland Kitchen nystatin (MYCOSTATIN/NYSTOP) powder Apply 1 application topically 3 (three) times daily. 60 g 1  .  HYDROcodone-acetaminophen (NORCO/VICODIN) 5-325 MG tablet Take 1-2 tablets by mouth every 6 (six) hours as needed for moderate pain or severe pain. (Patient not taking: Reported on 08/29/2020) 10 tablet 0   No current facility-administered medications for this encounter.     REVIEW OF SYSTEMS: On review of systems, the patient reports that she is doing well overall.  She is healing nicely and states her dermabond is starting to come off. She denies any pain in her breast.     PHYSICAL EXAM:  Unable to assess due to encounter type.   ECOG = 0  0 - Asymptomatic (Fully active, able to carry on all predisease activities without restriction)  1 - Symptomatic but completely ambulatory (Restricted in physically strenuous activity but ambulatory and able to carry out work of a light or sedentary nature. For example, light housework, office work)  2 - Symptomatic, <50% in bed during the day (Ambulatory and capable of all self care but unable to carry out any work activities. Up and about more than 50% of waking hours)  3 - Symptomatic, >50% in bed, but not bedbound (Capable of only limited self-care, confined to bed or chair 50% or more of waking hours)  4 - Bedbound (Completely disabled. Cannot carry on any self-care. Totally confined to bed or chair)  5 - Death   Eustace Pen MM, Creech RH, Tormey DC, et al. [phone]). "Toxicity and response criteria of the [facility]". [place] Oncol. 5 (6): 649-55    LABORATORY DATA:  Lab Results  Component Value Date   WBC 3.8 (L) 07/17/2020   HGB 12.9 07/17/2020   HCT 40.8 07/17/2020   MCV 85.2 07/17/2020   PLT 231 07/17/2020   Lab Results  Component Value Date   NA 141 07/29/2020   K 4.4 07/29/2020   CL 103 07/29/2020   CO2 30 07/29/2020   Lab Results  Component Value Date   ALT 16 07/17/2020   AST 22 07/17/2020   ALKPHOS 64 07/17/2020   BILITOT 0.4 07/17/2020      RADIOGRAPHY: No results found.      IMPRESSION/PLAN: 1. Intermediate grade, ER/PR positive DCIS of the right breast. Dr. Lisbeth Renshaw discusses the final pathology findings and reviews the nature of noninvasive breast disease. We discussed that she's done well surgically, and  Dr. Lisbeth Renshaw discusses the rationale to consider radiotherapy to reduce risks of local recurrence in the breast. She did have more generous margins and given this, he feels that radiation is more of an option as her prognostic panel showed her tumor was ER/PR positive.  We discussed the risks, benefits, short, and long term effects of radiotherapy, as well as the curative intent, and the patient is interested in foregoing radiotherapy.  During our last conversation, she was open to radiation, but hoping to avoid it. After our conversation, she elects to forgo radiation or antiestrogen. She will continue with plans for MRI in  August 2022.   Given current concerns for patient exposure during the COVID-19 pandemic, this encounter was conducted via telephone.  The patient has provided two factor identification and has given verbal consent for this type of encounter and has been advised to only accept a meeting of this type in a secure network environment. The time spent during this encounter was 45 minutes including preparation, discussion, and coordination of the patient's care. The attendants for this meeting include Hayden Pedro  and Georgianne Fick.  During the encounter, and Hayden Pedro were located at [facility] Radiation On[facility] Department.  Shatarra Wehling was located at home.       Carola Rhine, [facility]    **Disclaimer: This note was dictated with voice recognition software. Similar sounding words can inadvertently be transcribed and this note may contain transcription errors which may not have been corrected upon publication of note.**

## 2020-09-05 ENCOUNTER — Ambulatory Visit: Payer: Medicare Other | Admitting: Radiation Oncology

## 2020-09-30 DIAGNOSIS — L84 Corns and callosities: Secondary | ICD-10-CM | POA: Diagnosis not present

## 2020-10-30 DIAGNOSIS — L304 Erythema intertrigo: Secondary | ICD-10-CM | POA: Diagnosis not present

## 2020-12-09 ENCOUNTER — Ambulatory Visit
Admission: RE | Admit: 2020-12-09 | Discharge: 2020-12-09 | Disposition: A | Discharge status: Home / Self Care | Payer: Medicare Other | Source: Ambulatory Visit | Attending: Hematology | Admitting: Hematology

## 2020-12-09 ENCOUNTER — Other Ambulatory Visit: Payer: Self-pay

## 2020-12-09 DIAGNOSIS — N6489 Other specified disorders of breast: Secondary | ICD-10-CM | POA: Diagnosis not present

## 2020-12-09 DIAGNOSIS — D0511 Intraductal carcinoma in situ of right breast: Secondary | ICD-10-CM

## 2020-12-09 MED ORDER — GADOBUTROL 1 MMOL/ML IV SOLN
10.0000 mL | Freq: Once | INTRAVENOUS | Status: AC | PRN
  Administered 2020-12-09: 10 mL via INTRAVENOUS

## 2020-12-15 ENCOUNTER — Other Ambulatory Visit: Payer: Self-pay | Admitting: Hematology

## 2020-12-15 DIAGNOSIS — D0511 Intraductal carcinoma in situ of right breast: Secondary | ICD-10-CM

## 2021-01-08 DIAGNOSIS — Z0000 Encounter for general adult medical examination without abnormal findings: Secondary | ICD-10-CM | POA: Diagnosis not present

## 2021-01-08 DIAGNOSIS — E7800 Pure hypercholesterolemia, unspecified: Secondary | ICD-10-CM | POA: Diagnosis not present

## 2021-01-08 DIAGNOSIS — I10 Essential (primary) hypertension: Secondary | ICD-10-CM | POA: Diagnosis not present

## 2021-01-08 DIAGNOSIS — E559 Vitamin D deficiency, unspecified: Secondary | ICD-10-CM | POA: Diagnosis not present

## 2021-01-08 DIAGNOSIS — Z780 Asymptomatic menopausal state: Secondary | ICD-10-CM | POA: Diagnosis not present

## 2021-01-09 ENCOUNTER — Other Ambulatory Visit: Payer: Self-pay | Admitting: *Deleted

## 2021-01-09 ENCOUNTER — Telehealth: Payer: Self-pay | Admitting: *Deleted

## 2021-01-09 DIAGNOSIS — D0511 Intraductal carcinoma in situ of right breast: Secondary | ICD-10-CM

## 2021-01-09 DIAGNOSIS — R928 Other abnormal and inconclusive findings on diagnostic imaging of breast: Secondary | ICD-10-CM

## 2021-01-09 NOTE — Telephone Encounter (Signed)
Left message on patient's identified voicemail regarding an update on scheduling of imaging per MRI.

## 2021-01-13 ENCOUNTER — Telehealth: Payer: Self-pay | Admitting: *Deleted

## 2021-01-13 NOTE — Telephone Encounter (Signed)
Received a message from [place] at [facility] on 9/15 asking if a nurse had called this patient with MRI results and the need for add imaging.  I informed that it looks like Dr. Burr Medico sent a message to her nurse but I don't see a phone note that patient was called. Per Janett Billow [facility] had stated they did not call patient and that she received a phone call to ask if patient was scheduled and she did not see any notes either that it had been done.   I called the patient and the she was not aware of abnormal MRI from 8/15. She stated she did receive a phone call this am from [facility] to schedule an appointment and she was confused.  I discussed the MRI with her and the need for the additional imaging.  She is scheduled for this 9/26. She was offered an appointment for tomorrow but it coincided with other appointments. She was concerned that she had not received results from her MRI from 8/15. I explained the above to her but was unsure of why she did not receive a call. I informed her I would let Dr. Burr Medico know.  Safety zone will be placed due to delay in care.

## 2021-01-14 DIAGNOSIS — R319 Hematuria, unspecified: Secondary | ICD-10-CM | POA: Diagnosis not present

## 2021-01-14 DIAGNOSIS — E559 Vitamin D deficiency, unspecified: Secondary | ICD-10-CM | POA: Diagnosis not present

## 2021-01-14 DIAGNOSIS — Z0000 Encounter for general adult medical examination without abnormal findings: Secondary | ICD-10-CM | POA: Diagnosis not present

## 2021-01-14 DIAGNOSIS — Z23 Encounter for immunization: Secondary | ICD-10-CM | POA: Diagnosis not present

## 2021-01-14 DIAGNOSIS — I10 Essential (primary) hypertension: Secondary | ICD-10-CM | POA: Diagnosis not present

## 2021-01-14 DIAGNOSIS — Z1212 Encounter for screening for malignant neoplasm of rectum: Secondary | ICD-10-CM | POA: Diagnosis not present

## 2021-01-20 ENCOUNTER — Encounter: Payer: Self-pay | Admitting: Hematology

## 2021-01-20 DIAGNOSIS — R928 Other abnormal and inconclusive findings on diagnostic imaging of breast: Secondary | ICD-10-CM | POA: Diagnosis not present

## 2021-01-20 DIAGNOSIS — N6489 Other specified disorders of breast: Secondary | ICD-10-CM | POA: Diagnosis not present

## 2021-01-22 ENCOUNTER — Other Ambulatory Visit: Payer: Self-pay | Admitting: Internal Medicine

## 2021-01-22 DIAGNOSIS — E7800 Pure hypercholesterolemia, unspecified: Secondary | ICD-10-CM

## 2021-02-03 ENCOUNTER — Other Ambulatory Visit: Payer: Self-pay | Admitting: Radiology

## 2021-02-03 DIAGNOSIS — N6011 Diffuse cystic mastopathy of right breast: Secondary | ICD-10-CM | POA: Diagnosis not present

## 2021-02-03 DIAGNOSIS — N6313 Unspecified lump in the right breast, lower outer quadrant: Secondary | ICD-10-CM | POA: Diagnosis not present

## 2021-02-13 ENCOUNTER — Encounter: Payer: Self-pay | Admitting: Hematology

## 2021-04-15 DIAGNOSIS — D0511 Intraductal carcinoma in situ of right breast: Secondary | ICD-10-CM | POA: Diagnosis not present

## 2021-06-30 ENCOUNTER — Telehealth: Payer: Self-pay

## 2021-06-30 NOTE — Telephone Encounter (Signed)
Pt's daughter LVM stating the pt needs to reschedule her appt with [facility].  Pt's daughter (Buffy) is requesting a return call.  Returned Buffie's call and LVM stating they will need to contact [facility] to reschedule their appt.  Gave telephone number to [facility] for them to contact to reschedule.  Instructed pt's daughter to contact Dr. Ernestina Penna office should she have additional questions or concerns. ?

## 2021-07-21 DIAGNOSIS — R928 Other abnormal and inconclusive findings on diagnostic imaging of breast: Secondary | ICD-10-CM | POA: Diagnosis not present

## 2021-07-24 ENCOUNTER — Other Ambulatory Visit: Payer: Self-pay | Admitting: Diagnostic Radiology

## 2021-07-24 DIAGNOSIS — N6310 Unspecified lump in the right breast, unspecified quadrant: Secondary | ICD-10-CM

## 2021-07-24 DIAGNOSIS — Z853 Personal history of malignant neoplasm of breast: Secondary | ICD-10-CM

## 2021-08-14 ENCOUNTER — Ambulatory Visit
Admission: RE | Admit: 2021-08-14 | Discharge: 2021-08-14 | Disposition: A | Discharge status: Home / Self Care | Payer: Medicare Other | Source: Ambulatory Visit | Attending: Diagnostic Radiology | Admitting: Diagnostic Radiology

## 2021-08-14 DIAGNOSIS — Z853 Personal history of malignant neoplasm of breast: Secondary | ICD-10-CM

## 2021-08-14 DIAGNOSIS — N6310 Unspecified lump in the right breast, unspecified quadrant: Secondary | ICD-10-CM

## 2021-08-14 MED ORDER — GADOBUTROL 1 MMOL/ML IV SOLN
9.0000 mL | Freq: Once | INTRAVENOUS | Status: AC | PRN
  Administered 2021-08-14: 9 mL via INTRAVENOUS

## 2021-09-04 ENCOUNTER — Ambulatory Visit: Payer: Medicare Other | Attending: Family

## 2021-09-04 DIAGNOSIS — Z23 Encounter for immunization: Secondary | ICD-10-CM

## 2021-10-20 DIAGNOSIS — D0511 Intraductal carcinoma in situ of right breast: Secondary | ICD-10-CM | POA: Diagnosis not present

## 2021-11-04 ENCOUNTER — Encounter (HOSPITAL_COMMUNITY): Payer: Self-pay

## 2022-01-13 DIAGNOSIS — R319 Hematuria, unspecified: Secondary | ICD-10-CM | POA: Diagnosis not present

## 2022-01-13 DIAGNOSIS — E559 Vitamin D deficiency, unspecified: Secondary | ICD-10-CM | POA: Diagnosis not present

## 2022-01-13 DIAGNOSIS — Z0000 Encounter for general adult medical examination without abnormal findings: Secondary | ICD-10-CM | POA: Diagnosis not present

## 2022-01-13 DIAGNOSIS — E7800 Pure hypercholesterolemia, unspecified: Secondary | ICD-10-CM | POA: Diagnosis not present

## 2022-01-20 DIAGNOSIS — Z0000 Encounter for general adult medical examination without abnormal findings: Secondary | ICD-10-CM | POA: Diagnosis not present

## 2022-01-20 DIAGNOSIS — I10 Essential (primary) hypertension: Secondary | ICD-10-CM | POA: Diagnosis not present

## 2022-01-20 DIAGNOSIS — E559 Vitamin D deficiency, unspecified: Secondary | ICD-10-CM | POA: Diagnosis not present

## 2022-01-20 DIAGNOSIS — E7800 Pure hypercholesterolemia, unspecified: Secondary | ICD-10-CM | POA: Diagnosis not present

## 2022-01-20 DIAGNOSIS — Z23 Encounter for immunization: Secondary | ICD-10-CM | POA: Diagnosis not present

## 2022-04-14 DIAGNOSIS — H35372 Puckering of macula, left eye: Secondary | ICD-10-CM | POA: Diagnosis not present

## 2022-06-16 DIAGNOSIS — R350 Frequency of micturition: Secondary | ICD-10-CM | POA: Diagnosis not present

## 2022-06-16 DIAGNOSIS — I10 Essential (primary) hypertension: Secondary | ICD-10-CM | POA: Diagnosis not present

## 2022-06-16 DIAGNOSIS — N1831 Chronic kidney disease, stage 3a: Secondary | ICD-10-CM | POA: Diagnosis not present

## 2022-07-14 DIAGNOSIS — R350 Frequency of micturition: Secondary | ICD-10-CM | POA: Diagnosis not present

## 2022-07-14 DIAGNOSIS — I10 Essential (primary) hypertension: Secondary | ICD-10-CM | POA: Diagnosis not present

## 2022-07-14 DIAGNOSIS — N1831 Chronic kidney disease, stage 3a: Secondary | ICD-10-CM | POA: Diagnosis not present

## 2022-07-14 DIAGNOSIS — E7800 Pure hypercholesterolemia, unspecified: Secondary | ICD-10-CM | POA: Diagnosis not present

## 2022-07-14 DIAGNOSIS — Z0000 Encounter for general adult medical examination without abnormal findings: Secondary | ICD-10-CM | POA: Diagnosis not present

## 2022-07-21 DIAGNOSIS — E559 Vitamin D deficiency, unspecified: Secondary | ICD-10-CM | POA: Diagnosis not present

## 2022-07-21 DIAGNOSIS — I10 Essential (primary) hypertension: Secondary | ICD-10-CM | POA: Diagnosis not present

## 2022-07-21 DIAGNOSIS — D72819 Decreased white blood cell count, unspecified: Secondary | ICD-10-CM | POA: Diagnosis not present

## 2022-07-21 DIAGNOSIS — E7800 Pure hypercholesterolemia, unspecified: Secondary | ICD-10-CM | POA: Diagnosis not present

## 2022-07-24 DIAGNOSIS — Z853 Personal history of malignant neoplasm of breast: Secondary | ICD-10-CM | POA: Diagnosis not present

## 2022-07-30 ENCOUNTER — Encounter: Payer: Self-pay | Admitting: Hematology

## 2022-09-17 DIAGNOSIS — M545 Low back pain, unspecified: Secondary | ICD-10-CM | POA: Diagnosis not present

## 2022-10-20 DIAGNOSIS — D0511 Intraductal carcinoma in situ of right breast: Secondary | ICD-10-CM | POA: Diagnosis not present

## 2022-12-18 IMAGING — MR MR BREAST BILAT WO/W CM
7 of 12 series · 27 of 48 positions shown · IV contrast (10 ml gadavist)
Comparison: No prior MRI available for comparison. Correlation made
with prior mammogram and ultrasound images.

CLINICAL DATA: 79-year-old female presenting for annual
surveillance MRI status post right breast lumpectomy in Sunday July, 2020 for a 7 mm area of DCIS in the medial right breast

LABS:  Creatinine of 1.18 mg/dL and GFR of 47 on 07/17/2020.
EXAM:
BILATERAL BREAST MRI WITH AND WITHOUT CONTRAST
TECHNIQUE: Multiplanar, multisequence MR images of both breasts were obtained
prior to and following the intravenous administration of 10 ml of
Gadavist

[Series 2: t2_tirm_tra ipat (a-p) · axial · 3.0mm · 0.70mm/px · 1 of 66 slices shown]
[im 1/66]
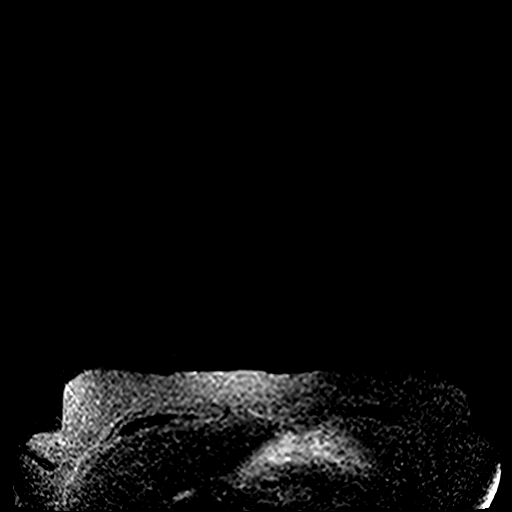

[Series 3: fl3d pre-cm no · axial · non-contrast · 0.9mm · 0.94mm/px · z∈[-122,+108]mm · 5 of 252 slices shown]
[im 1/252]
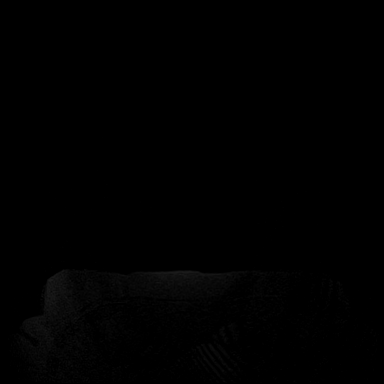
[im 63/252]
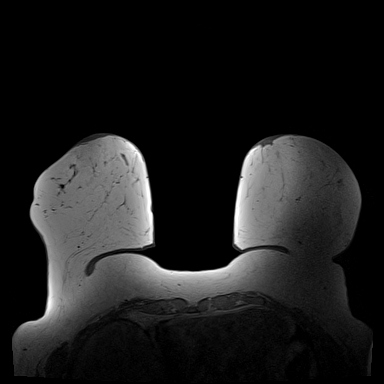
[im 126/252]
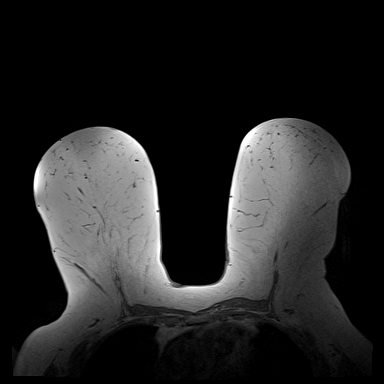
[im 189/252]
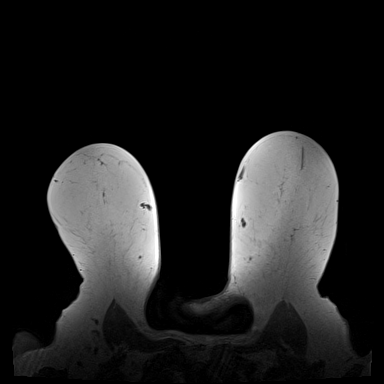
[im 252/252]
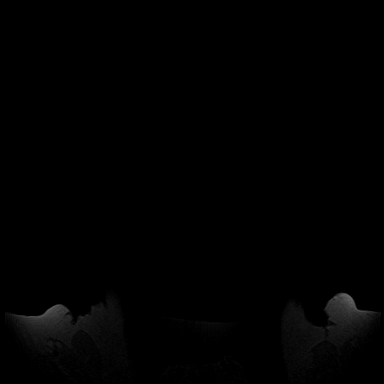

[Series 4: fl3d pre-cm · axial · non-contrast · 0.9mm · 0.87mm/px · z∈[-122,+108]mm · 5 of 252 slices shown]
[im 1/252]
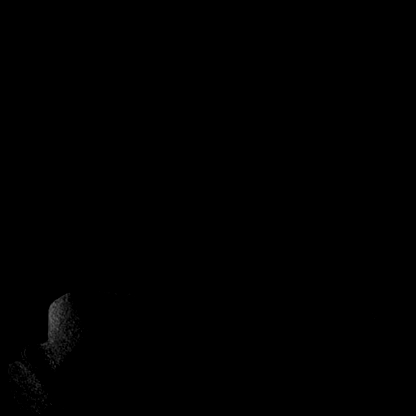
[im 63/252]
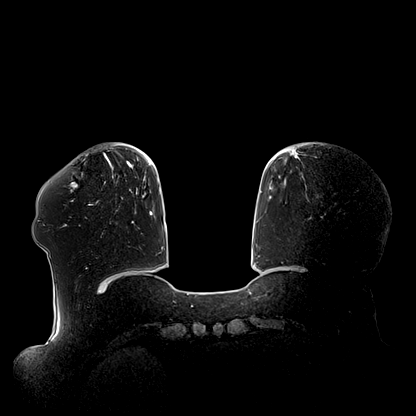
[im 126/252]
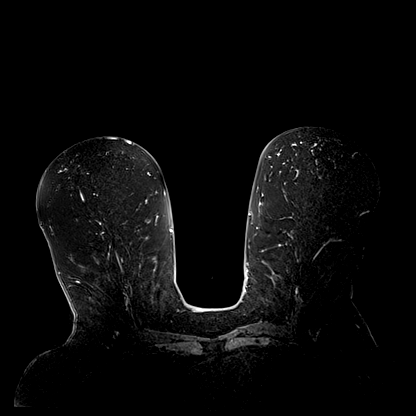
[im 189/252]
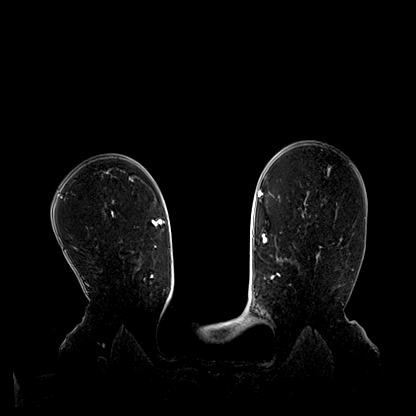
[im 252/252]
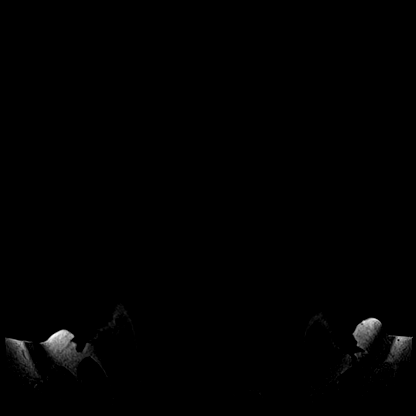

[Series 5: fl3d post-cm 20 · axial · 0.9mm · 0.87mm/px · z∈[-122,+108]mm · 5 of 252 slices shown (1 of 3)]
[im 1/252]
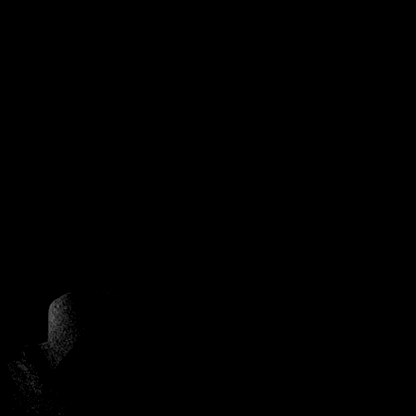
[im 63/252]
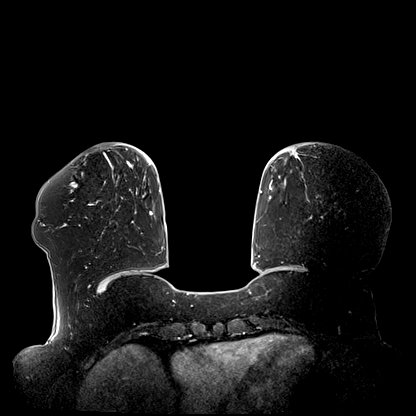
[im 126/252]
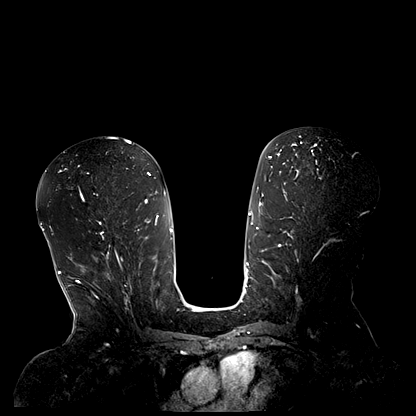
[im 189/252]
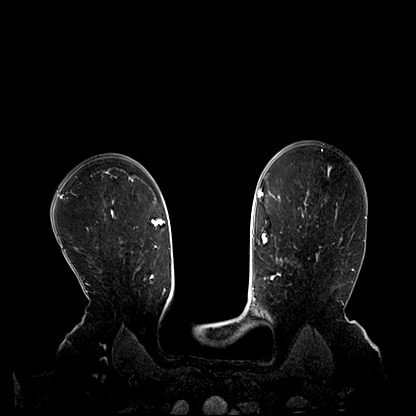
[im 252/252]
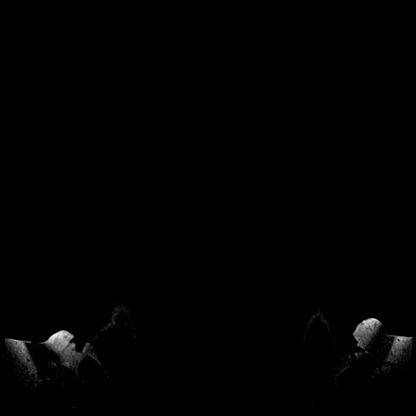

[Series 6: fl3d post-cm 20 · axial · 0.9mm · 0.87mm/px · z∈[-122,+108]mm · 5 of 256 slices shown (2 of 3)]
[im 1/256]
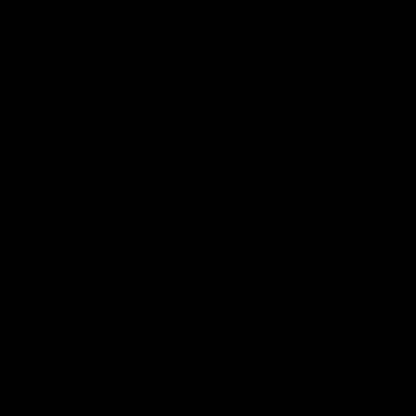
[im 64/256]
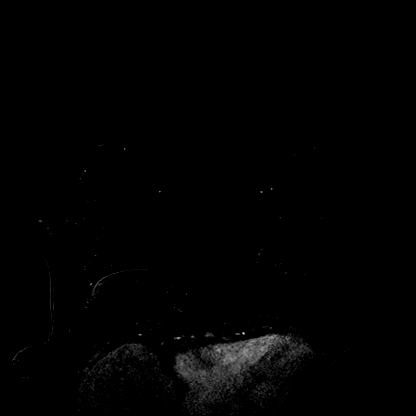
[im 128/256]
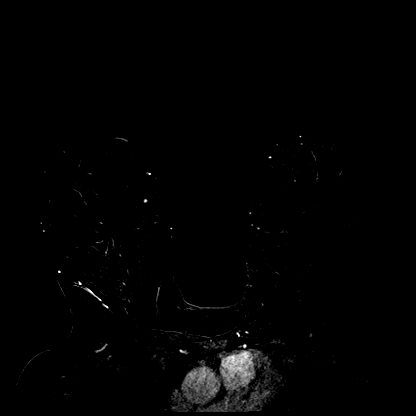
[im 192/256]
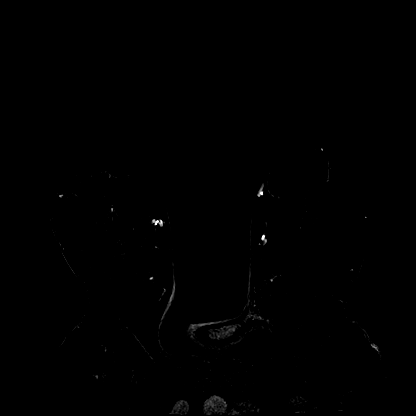
[im 256/256]
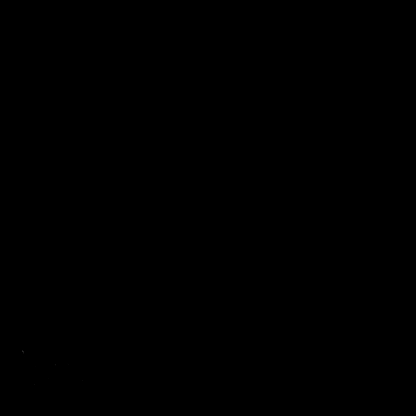

[Series 7: fl3d post-cm 20 · axial · 230.4mm · 0.87mm/px · 1 of 1 slices shown (3 of 3)]
[im 1/1]
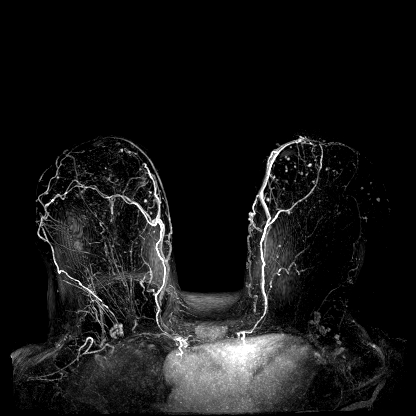

[Series 8: fl3d post-cm 3min · axial · 0.9mm · 0.87mm/px · z∈[-122,+63]mm · 5 of 250 slices shown]
[im 1/250]
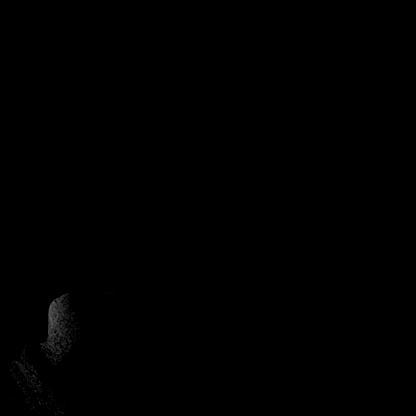
[im 50/250]
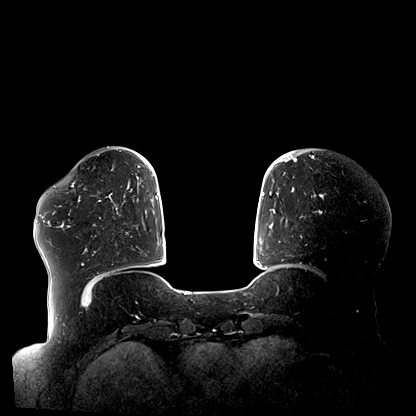
[im 100/250]
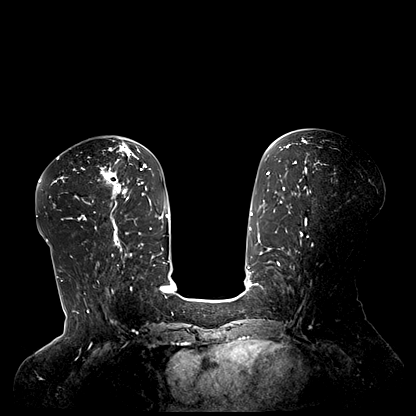
[im 150/250]
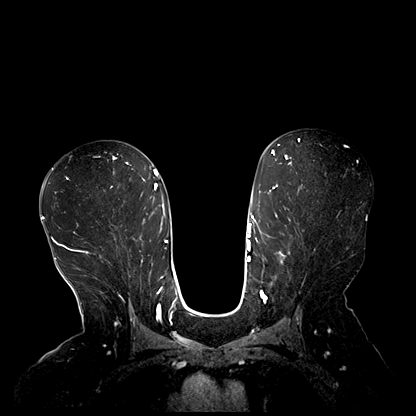
[im 200/250]
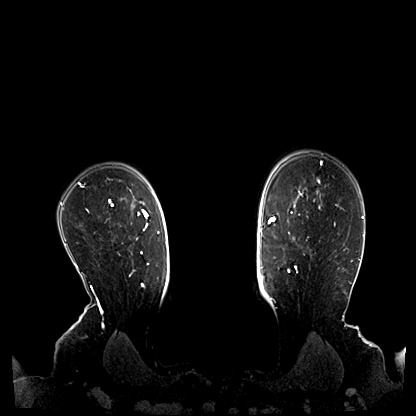

[27 of 48 positions shown; findings below may reference images not displayed]

Three-dimensional MR images were rendered by post-processing of the
original MR data on an independent workstation. The
three-dimensional MR images were interpreted, and findings are
reported in the following complete MRI report for this study. Three
dimensional images were evaluated at the independent interpreting
workstation using the DynaCAD thin client.
FINDINGS: Breast composition: b. Scattered fibroglandular tissue.

Background parenchymal enhancement: Mild

Right breast:

Surgical changes from the patient's lumpectomy are noted in the
upper inner middle to anterior depth of the left breast. There are
scattered foci of enhancement in the left breast which are less than
4 mm, also seen in the left breast. This is compatible with
background parenchymal enhancement.

In the lateral right breast, are a few scattered areas of abnormal
enhancement spanning to involve at least 7-8 cm of tissue in the
upper outer and lower outer quadrants. These are described as
follows (and best appreciated on the 5 minute delayed postcontrast
images, series 12):

The most suspicious area is in the anterior lower outer right breast
(image 183) spanning approximately 1.5 cm. On the T1 images, this
appears as an irregular mass with spiculated margins. There is some
disrupted linear enhancement just inferior to this (image 192).

There is a small superficial enhancing mass in the lateral middle
depth of the right breast measuring 0.9 cm (image 170). This appears
irregular and possibly spiculated on the T1 images.

There is a 1.3 cm irregular branching and enhancing mass in the
lateral to upper-outer anterior right breast also with a masslike
correlate on the T1 images (image 141).

There is a 1.0 cm faint area of non mass enhancement in the lower
outer middle depth of the right breast (image 215).

Linear branching non mass enhancement spanning 3.3 cm is seen in the
lower outer lateral right breast (image 199).

Left breast: No mass or abnormal enhancement. There are scattered
foci of enhancement in the left breast which are less than 4 mm,
also seen in the right breast. This is compatible with background
parenchymal enhancement.

Lymph nodes: No abnormal appearing lymph nodes.

Ancillary findings:  None.
IMPRESSION: 1. There are multiple areas of suspicious enhancement in the upper
outer and lower outer quadrant of the right breast spanning at least
7-8 cm of tissue. Some enhancement is mass-like and others have a
branching linear pattern.

2.  No evidence of right axillary lymphadenopathy.

3.  No evidence of left breast malignancy.

RECOMMENDATION:
Diagnostic images of the right breast including full paddle
tomosynthesis CC and MLO views (the prior full paddle views are 2D),
as well as spot compression tomosynthesis images through the lower
outer anterior/retroareolar right breast to evaluate for a correlate
of the spiculated 1.5 cm mass (series 12, image 183). Additional
diagnostic images of the lateral right breast as well as ultrasound
to find any additional sonographic abnormalities may also need to be
performed. If one or multiple of these masses are identified,
ultrasound-guided biopsy would be recommended. If not, MRI guided
biopsy of at least 2 sites in the right breast would be recommended.

BI-RADS CATEGORY  4: Suspicious.

## 2023-01-14 DIAGNOSIS — R0989 Other specified symptoms and signs involving the circulatory and respiratory systems: Secondary | ICD-10-CM | POA: Diagnosis not present

## 2023-01-14 DIAGNOSIS — E559 Vitamin D deficiency, unspecified: Secondary | ICD-10-CM | POA: Diagnosis not present

## 2023-01-14 DIAGNOSIS — R202 Paresthesia of skin: Secondary | ICD-10-CM | POA: Diagnosis not present

## 2023-01-20 DIAGNOSIS — R0989 Other specified symptoms and signs involving the circulatory and respiratory systems: Secondary | ICD-10-CM | POA: Diagnosis not present

## 2023-01-21 DIAGNOSIS — Z0000 Encounter for general adult medical examination without abnormal findings: Secondary | ICD-10-CM | POA: Diagnosis not present

## 2023-01-21 DIAGNOSIS — E559 Vitamin D deficiency, unspecified: Secondary | ICD-10-CM | POA: Diagnosis not present

## 2023-01-28 DIAGNOSIS — Z0000 Encounter for general adult medical examination without abnormal findings: Secondary | ICD-10-CM | POA: Diagnosis not present

## 2023-01-28 DIAGNOSIS — I10 Essential (primary) hypertension: Secondary | ICD-10-CM | POA: Diagnosis not present

## 2023-01-28 DIAGNOSIS — D72819 Decreased white blood cell count, unspecified: Secondary | ICD-10-CM | POA: Diagnosis not present

## 2023-01-28 DIAGNOSIS — N1831 Chronic kidney disease, stage 3a: Secondary | ICD-10-CM | POA: Diagnosis not present

## 2023-01-28 DIAGNOSIS — R319 Hematuria, unspecified: Secondary | ICD-10-CM | POA: Diagnosis not present

## 2023-01-28 DIAGNOSIS — E559 Vitamin D deficiency, unspecified: Secondary | ICD-10-CM | POA: Diagnosis not present

## 2023-07-28 DIAGNOSIS — Z1231 Encounter for screening mammogram for malignant neoplasm of breast: Secondary | ICD-10-CM | POA: Diagnosis not present

## 2023-08-25 DIAGNOSIS — L03116 Cellulitis of left lower limb: Secondary | ICD-10-CM | POA: Diagnosis not present

## 2023-08-25 DIAGNOSIS — W57XXXA Bitten or stung by nonvenomous insect and other nonvenomous arthropods, initial encounter: Secondary | ICD-10-CM | POA: Diagnosis not present

## 2023-08-25 DIAGNOSIS — S90562A Insect bite (nonvenomous), left ankle, initial encounter: Secondary | ICD-10-CM | POA: Diagnosis not present

## 2023-08-27 DIAGNOSIS — W57XXXA Bitten or stung by nonvenomous insect and other nonvenomous arthropods, initial encounter: Secondary | ICD-10-CM | POA: Diagnosis not present

## 2023-08-27 DIAGNOSIS — S90562A Insect bite (nonvenomous), left ankle, initial encounter: Secondary | ICD-10-CM | POA: Diagnosis not present

## 2023-10-21 DIAGNOSIS — R29898 Other symptoms and signs involving the musculoskeletal system: Secondary | ICD-10-CM | POA: Diagnosis not present

## 2023-10-21 DIAGNOSIS — M79604 Pain in right leg: Secondary | ICD-10-CM | POA: Diagnosis not present

## 2023-11-15 DIAGNOSIS — D0511 Intraductal carcinoma in situ of right breast: Secondary | ICD-10-CM | POA: Diagnosis not present

## 2023-11-22 ENCOUNTER — Encounter: Payer: Self-pay | Admitting: Neurology

## 2024-01-24 ENCOUNTER — Encounter: Payer: Self-pay | Admitting: Neurology

## 2024-01-24 ENCOUNTER — Ambulatory Visit: Admitting: Neurology

## 2024-01-24 VITALS — BP 138/81 | HR 67 | Ht 59.0 in | Wt 199.0 lb

## 2024-01-24 DIAGNOSIS — R202 Paresthesia of skin: Secondary | ICD-10-CM | POA: Diagnosis not present

## 2024-01-24 DIAGNOSIS — M79604 Pain in right leg: Secondary | ICD-10-CM | POA: Diagnosis not present

## 2024-01-24 NOTE — Progress Notes (Signed)
 [facility] Neurology Division Clinic Note - Initial Visit   Date: 01/24/2024   [place] Severs MRN: [id number] DOB: 1940/05/08   Dear Ronal Leeroy Marie, FNP:  Thank you for your kind referral of Diana Mcknight for consultation of right leg pain. Although her history is well known to you, please allow us  to reiterate it for the purpose of our medical record. The patient was accompanied to the clinic by self.  Diana Mcknight is a 83 y.o. right-handed female with hypertension presenting for evaluation of right leg pain.   IMPRESSION/PLAN: Chronic right leg pain and paresthesias.  Sensory symptoms may be stemming from S1 radiculopathy.  Her calf pain seems to be more musculoskeletal (achy/throbbing).  NCS/EMG of the right leg will be ordered to better evaluate her symptoms.  Muscle relaxer for pain was declined.  She will continue to do home exercises for leg stretching.    Further recommendations pending results.   ------------------------------------------------------------- History of present illness: She reports having severe right gastrocnemius muscle strain in her 40s, where she was unable to walk for two weeks.  Since this time, she has intermittent spells of achy/throbbing involving the right gastrocnemius.  When it occurs, the pain can be so severe that she is unable to move the leg, stand or walk.  On one occasion, she had to sit on the commode for 3 hours before she was able to stand up and walk.  Over the past few years, she has also developed tingling and burning involving the sole and heel on the right foot. This has improved over the past 6 months.  She denies shooting pain down the right leg.  She used to have low back pain which has improved after changing her mattress.    She has previously seen Sports Medicine who recommended leg stretches.    She stays active and enjoys walking.   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  She moved to [facility] in 2011 from [place],  [place] .   Nonsmoker and does not drink alcohol.    Past Medical History:  Diagnosis Date   Asthma    childhood asthma   Chronic cystitis    Hx of adenomatous colonic polyps 02/24/2016   Hyperlipidemia    Hypertension     Past Surgical History:  Procedure Laterality Date   BLADDER SURGERY     fulguration   BREAST LUMPECTOMY WITH RADIOACTIVE SEED LOCALIZATION Right 07/31/2020   Procedure: RIGHT BREAST LUMPECTOMY WITH RADIOACTIVE SEED LOCALIZATION;  Surgeon: Curvin Deward MOULD, MD;  Location: [facility];  Service: General;  Laterality: Right;   COLONOSCOPY     MYOMECTOMY     WISDOM TOOTH EXTRACTION       Medications:  Outpatient Encounter Medications as of 01/24/2024  Medication Sig   Cholecalciferol (VITAMIN D) 2000 units CAPS Take by mouth daily.   hydrochlorothiazide (HYDRODIURIL) 25 MG tablet Take 25 mg by mouth daily.   losartan (COZAAR) 50 MG tablet Take 50 mg by mouth daily.   nystatin  (MYCOSTATIN /NYSTOP ) powder Apply 1 application topically 3 (three) times daily.   HYDROcodone -acetaminophen  (NORCO/VICODIN) 5-325 MG tablet Take 1-2 tablets by mouth every 6 (six) hours as needed for moderate pain or severe pain. (Patient not taking: Reported on 01/24/2024)   No facility-administered encounter medications on file as of 01/24/2024.    Allergies:  Allergies  Allergen Reactions   Erythromycin Diarrhea   Penicillins Hives    Family History: Family History  Problem Relation Age of Onset   Cervical cancer  Mother    Liver cancer Brother    Breast cancer Cousin    Colon cancer Neg Hx    Esophageal cancer Neg Hx    Pancreatic cancer Neg Hx    Rectal cancer Neg Hx    Stomach cancer Neg Hx     Social History: Social History   Tobacco Use   Smoking status: Never   Smokeless tobacco: Never  Vaping Use   Vaping status: Never Used  Substance Use Topics   Alcohol use: No   Drug use: No   Social History   Social History Narrative   Are you right  handed or left handed? Right Handed   Are you currently employed ? No   What is your current occupation? Retired    Do you live at home alone? Yes   Who lives with you?    What type of home do you live in: 1 story or 2 story? Ranch Style Home        Vital Signs:  BP 138/81   Pulse 67   Ht 4' 11 (1.499 m)   Wt 199 lb (90.3 kg)   SpO2 97%   BMI 40.19 kg/m   Neurological Exam: MENTAL STATUS including orientation to time, place, person, recent and remote memory, attention span and concentration, language, and fund of knowledge is normal.  Speech is not dysarthric.  CRANIAL NERVES: II:  No visual field defects.     III-IV-VI: Pupils equal round and reactive to light.  Normal conjugate, extra-ocular eye movements in all directions of gaze.  No nystagmus.  No ptosis.   V:  Normal facial sensation.    VII:  Normal facial symmetry and movements.   VIII:  Normal hearing and vestibular function.   IX-X:  Normal palatal movement.   XI:  Normal shoulder shrug and head rotation.   XII:  Normal tongue strength and range of motion, no deviation or fasciculation.  MOTOR:  No atrophy, fasciculations or abnormal movements.  No pronator drift.   Upper Extremity:  Right  Left  Deltoid  5/5   5/5   Biceps  5/5   5/5   Triceps  5/5   5/5   Wrist extensors  5/5   5/5   Wrist flexors  5/5   5/5   Finger extensors  5/5   5/5   Finger flexors  5/5   5/5   Dorsal interossei  5/5   5/5   Abductor pollicis  5/5   5/5   Tone (Ashworth scale)  0  0   Lower Extremity:  Right  Left  Hip flexors  5/5   5/5   Knee flexors  5/5   5/5   Knee extensors  5/5   5/5   Dorsiflexors  5/5   5/5   Plantarflexors  5/5   5/5   Toe extensors  5/5   5/5   Toe flexors  5/5   5/5   Tone (Ashworth scale)  0  0   MSRs:                                           Right        Left brachioradialis 2+  2+  biceps 2+  2+  triceps 2+  2+  patellar 3+  3+  ankle jerk 0  1+  plantar response down  down   SENSORY:   Normal and symmetric perception of light touch, pinprick, and vibration.  Romberg's sign absent.   COORDINATION/GAIT: Normal finger-to- nose-finger.  Intact rapid alternating movements bilaterally.  Gait narrow based and stable. Tandem and stressed gait intact.     Thank you for allowing me to participate in patient's care.  If I can answer any additional questions, I would be pleased to do so.    Sincerely,    Samyra Limb K. Tobie, DO

## 2024-01-24 NOTE — Patient Instructions (Signed)
Nerve testing of the right leg  ELECTROMYOGRAM AND NERVE CONDUCTION STUDIES (EMG/NCS) INSTRUCTIONS  How to Prepare The neurologist conducting the EMG will need to know if you have certain medical conditions. Tell the neurologist and other EMG lab personnel if you: Have a pacemaker or any other electrical medical device Take blood-thinning medications Have hemophilia, a blood-clotting disorder that causes prolonged bleeding Bathing Take a shower or bath shortly before your exam in order to remove oils from your skin. Don't apply lotions or creams before the exam.  What to Expect You'll likely be asked to change into a hospital gown for the procedure and lie down on an examination table. The following explanations can help you understand what will happen during the exam.  Electrodes. The neurologist or a technician places surface electrodes at various locations on your skin depending on where you're experiencing symptoms. Or the neurologist may insert needle electrodes at different sites depending on your symptoms.  Sensations. The electrodes will at times transmit a tiny electrical current that you may feel as a twinge or spasm. The needle electrode may cause discomfort or pain that usually ends shortly after the needle is removed. If you are concerned about discomfort or pain, you may want to talk to the neurologist about taking a short break during the exam.  Instructions. During the needle EMG, the neurologist will assess whether there is any spontaneous electrical activity when the muscle is at rest - activity that isn't present in healthy muscle tissue - and the degree of activity when you slightly contract the muscle.  He or she will give you instructions on resting and contracting a muscle at appropriate times. Depending on what muscles and nerves the neurologist is examining, he or she may ask you to change positions during the exam.  After your EMG You may experience some temporary, minor  bruising where the needle electrode was inserted into your muscle. This bruising should fade within several days. If it persists, contact your primary care doctor.   

## 2024-01-27 DIAGNOSIS — E7800 Pure hypercholesterolemia, unspecified: Secondary | ICD-10-CM | POA: Diagnosis not present

## 2024-01-27 DIAGNOSIS — Z0000 Encounter for general adult medical examination without abnormal findings: Secondary | ICD-10-CM | POA: Diagnosis not present

## 2024-01-27 DIAGNOSIS — E559 Vitamin D deficiency, unspecified: Secondary | ICD-10-CM | POA: Diagnosis not present

## 2024-02-03 DIAGNOSIS — R293 Abnormal posture: Secondary | ICD-10-CM | POA: Diagnosis not present

## 2024-02-03 DIAGNOSIS — E559 Vitamin D deficiency, unspecified: Secondary | ICD-10-CM | POA: Diagnosis not present

## 2024-02-03 DIAGNOSIS — Z23 Encounter for immunization: Secondary | ICD-10-CM | POA: Diagnosis not present

## 2024-02-03 DIAGNOSIS — Z0000 Encounter for general adult medical examination without abnormal findings: Secondary | ICD-10-CM | POA: Diagnosis not present

## 2024-02-03 DIAGNOSIS — I10 Essential (primary) hypertension: Secondary | ICD-10-CM | POA: Diagnosis not present

## 2024-03-29 ENCOUNTER — Other Ambulatory Visit: Payer: Self-pay

## 2024-03-29 DIAGNOSIS — R202 Paresthesia of skin: Secondary | ICD-10-CM

## 2024-04-06 ENCOUNTER — Encounter: Admitting: Neurology
